# Patient Record
Sex: Female | Born: 1935 | Race: White | Hispanic: No | Smoking: Former smoker
Health system: Southern US, Community
[De-identification: ages and names within clinical notes are randomized; demographics above are authoritative.]

## PROBLEM LIST (undated history)

## (undated) DIAGNOSIS — I2510 Atherosclerotic heart disease of native coronary artery without angina pectoris: Secondary | ICD-10-CM

## (undated) DIAGNOSIS — M1990 Unspecified osteoarthritis, unspecified site: Secondary | ICD-10-CM

## (undated) DIAGNOSIS — T753XXA Motion sickness, initial encounter: Secondary | ICD-10-CM

## (undated) DIAGNOSIS — K219 Gastro-esophageal reflux disease without esophagitis: Secondary | ICD-10-CM

## (undated) DIAGNOSIS — G5601 Carpal tunnel syndrome, right upper limb: Secondary | ICD-10-CM

## (undated) DIAGNOSIS — R51 Headache: Secondary | ICD-10-CM

## (undated) DIAGNOSIS — R519 Headache, unspecified: Secondary | ICD-10-CM

## (undated) DIAGNOSIS — T4145XA Adverse effect of unspecified anesthetic, initial encounter: Secondary | ICD-10-CM

## (undated) DIAGNOSIS — K222 Esophageal obstruction: Secondary | ICD-10-CM

## (undated) DIAGNOSIS — T8859XA Other complications of anesthesia, initial encounter: Secondary | ICD-10-CM

## (undated) DIAGNOSIS — A0472 Enterocolitis due to Clostridium difficile, not specified as recurrent: Secondary | ICD-10-CM

## (undated) DIAGNOSIS — I2109 ST elevation (STEMI) myocardial infarction involving other coronary artery of anterior wall: Secondary | ICD-10-CM

## (undated) DIAGNOSIS — Z972 Presence of dental prosthetic device (complete) (partial): Secondary | ICD-10-CM

## (undated) DIAGNOSIS — R42 Dizziness and giddiness: Secondary | ICD-10-CM

## (undated) DIAGNOSIS — I7300 Raynaud's syndrome without gangrene: Secondary | ICD-10-CM

## (undated) DIAGNOSIS — K5730 Diverticulosis of large intestine without perforation or abscess without bleeding: Secondary | ICD-10-CM

## (undated) DIAGNOSIS — G589 Mononeuropathy, unspecified: Secondary | ICD-10-CM

## (undated) DIAGNOSIS — K08109 Complete loss of teeth, unspecified cause, unspecified class: Secondary | ICD-10-CM

## (undated) DIAGNOSIS — J329 Chronic sinusitis, unspecified: Secondary | ICD-10-CM

## (undated) DIAGNOSIS — E785 Hyperlipidemia, unspecified: Secondary | ICD-10-CM

## (undated) DIAGNOSIS — H9191 Unspecified hearing loss, right ear: Secondary | ICD-10-CM

## (undated) HISTORY — PX: VARICOSE VEIN SURGERY: SHX832

## (undated) HISTORY — PX: KNEE ARTHROSCOPY: SUR90

## (undated) HISTORY — PX: ABDOMINAL HYSTERECTOMY: SHX81

## (undated) HISTORY — PX: ESOPHAGOGASTRODUODENOSCOPY (EGD) WITH ESOPHAGEAL DILATION: SHX5812

## (undated) HISTORY — PX: EYE SURGERY: SHX253

## (undated) HISTORY — PX: RETINAL DETACHMENT SURGERY: SHX105

## (undated) HISTORY — PX: COLONOSCOPY: SHX174

## (undated) HISTORY — PX: TONSILLECTOMY: SUR1361

---

## 2002-03-21 DIAGNOSIS — I2109 ST elevation (STEMI) myocardial infarction involving other coronary artery of anterior wall: Secondary | ICD-10-CM

## 2002-03-21 HISTORY — DX: ST elevation (STEMI) myocardial infarction involving other coronary artery of anterior wall: I21.09

## 2003-01-25 ENCOUNTER — Other Ambulatory Visit: Payer: Self-pay

## 2003-01-26 ENCOUNTER — Other Ambulatory Visit: Payer: Self-pay

## 2003-01-27 HISTORY — PX: CARDIAC CATHETERIZATION: SHX172

## 2003-05-15 ENCOUNTER — Other Ambulatory Visit: Payer: Self-pay

## 2003-08-24 ENCOUNTER — Other Ambulatory Visit: Payer: Self-pay

## 2003-11-24 ENCOUNTER — Other Ambulatory Visit: Payer: Self-pay

## 2003-12-26 ENCOUNTER — Ambulatory Visit: Payer: Self-pay | Admitting: Gastroenterology

## 2004-04-16 ENCOUNTER — Ambulatory Visit: Payer: Self-pay | Admitting: Gastroenterology

## 2004-07-23 ENCOUNTER — Emergency Department: Payer: Self-pay | Admitting: Unknown Physician Specialty

## 2004-07-23 ENCOUNTER — Other Ambulatory Visit: Payer: Self-pay

## 2005-01-01 ENCOUNTER — Inpatient Hospital Stay: Payer: Self-pay | Admitting: Internal Medicine

## 2005-01-01 ENCOUNTER — Other Ambulatory Visit: Payer: Self-pay

## 2005-10-15 ENCOUNTER — Observation Stay: Payer: Self-pay | Admitting: Internal Medicine

## 2005-10-15 ENCOUNTER — Other Ambulatory Visit: Payer: Self-pay

## 2007-02-03 ENCOUNTER — Ambulatory Visit: Payer: Self-pay | Admitting: Gastroenterology

## 2007-05-24 ENCOUNTER — Ambulatory Visit: Payer: Self-pay | Admitting: Gastroenterology

## 2007-05-31 ENCOUNTER — Ambulatory Visit: Payer: Self-pay | Admitting: Gastroenterology

## 2007-06-15 ENCOUNTER — Ambulatory Visit: Payer: Self-pay | Admitting: Orthopedic Surgery

## 2007-06-19 ENCOUNTER — Ambulatory Visit: Payer: Self-pay | Admitting: Orthopedic Surgery

## 2007-06-19 HISTORY — PX: FINGER SURGERY: SHX640

## 2007-07-24 ENCOUNTER — Ambulatory Visit: Payer: Self-pay | Admitting: Surgery

## 2008-02-29 ENCOUNTER — Ambulatory Visit: Payer: Self-pay | Admitting: Orthopedic Surgery

## 2008-03-21 DIAGNOSIS — A0472 Enterocolitis due to Clostridium difficile, not specified as recurrent: Secondary | ICD-10-CM

## 2008-03-21 HISTORY — DX: Enterocolitis due to Clostridium difficile, not specified as recurrent: A04.72

## 2009-01-22 ENCOUNTER — Inpatient Hospital Stay: Payer: Self-pay | Admitting: Internal Medicine

## 2009-02-13 ENCOUNTER — Ambulatory Visit: Payer: Self-pay | Admitting: Cardiology

## 2009-02-13 HISTORY — PX: CORONARY ANGIOPLASTY WITH STENT PLACEMENT: SHX49

## 2009-07-06 ENCOUNTER — Ambulatory Visit: Payer: Self-pay | Admitting: Internal Medicine

## 2009-08-04 ENCOUNTER — Ambulatory Visit: Payer: Self-pay | Admitting: Unknown Physician Specialty

## 2009-11-20 ENCOUNTER — Ambulatory Visit: Payer: Self-pay | Admitting: Unknown Physician Specialty

## 2009-11-25 ENCOUNTER — Inpatient Hospital Stay: Payer: Self-pay | Admitting: Unknown Physician Specialty

## 2009-11-25 HISTORY — PX: TOTAL HIP ARTHROPLASTY: SHX124

## 2009-11-25 HISTORY — PX: JOINT REPLACEMENT: SHX530

## 2009-11-28 LAB — PATHOLOGY REPORT

## 2009-11-30 ENCOUNTER — Encounter: Payer: Self-pay | Admitting: Internal Medicine

## 2009-12-19 ENCOUNTER — Encounter: Payer: Self-pay | Admitting: Internal Medicine

## 2010-06-22 ENCOUNTER — Ambulatory Visit: Payer: Self-pay | Admitting: Unknown Physician Specialty

## 2011-01-25 ENCOUNTER — Ambulatory Visit: Payer: Self-pay | Admitting: Unknown Physician Specialty

## 2011-02-09 ENCOUNTER — Ambulatory Visit: Payer: Self-pay | Admitting: Internal Medicine

## 2012-03-20 ENCOUNTER — Ambulatory Visit: Payer: Self-pay | Admitting: Ophthalmology

## 2012-03-20 DIAGNOSIS — I499 Cardiac arrhythmia, unspecified: Secondary | ICD-10-CM

## 2012-03-27 ENCOUNTER — Ambulatory Visit: Payer: Self-pay | Admitting: Ophthalmology

## 2012-04-17 ENCOUNTER — Ambulatory Visit: Payer: Self-pay | Admitting: Ophthalmology

## 2012-09-06 ENCOUNTER — Ambulatory Visit: Payer: Self-pay | Admitting: Internal Medicine

## 2014-07-11 NOTE — Op Note (Signed)
PATIENT NAME:  Amber Sanford, Amber Sanford MR#:  [id number] DATE OF BIRTH:  [birth year]  DATE OF PROCEDURE:  04/17/2012  PREOPERATIVE DIAGNOSIS: Visually significant cataract of the left eye.   POSTOPERATIVE DIAGNOSIS: Visually significant cataract of the left eye.   OPERATIVE PROCEDURE: Cataract extraction by phacoemulsification with implant of intraocular lens to left eye.   SURGEON: Birder Robson, MD.   ANESTHESIA:  1. Managed anesthesia care.  2. Topical tetracaine drops followed by 2% Xylocaine jelly applied in the preoperative holding area.   COMPLICATIONS: None.   TECHNIQUE:  stop and chop  DESCRIPTION OF PROCEDURE: The patient was examined and consented in the preoperative holding area where the aforementioned topical anesthesia was applied to the left eye and then brought back to the Operating Room where the left eye was prepped and draped in the usual sterile ophthalmic fashion and a lid speculum was placed. A paracentesis was created with the side port blade and the anterior chamber was filled with viscoelastic. A near clear corneal incision was performed with the steel keratome. A continuous curvilinear capsulorrhexis was performed with a cystotome followed by the capsulorrhexis forceps. Hydrodissection and hydrodelineation were carried out with BSS on a blunt cannula. The lens was removed in a stop and chop technique and the remaining cortical material was removed with the irrigation-aspiration handpiece. The capsular bag was inflated with viscoelastic and the IOL was placed in the capsular bag without complication. The remaining viscoelastic was removed from the eye with the irrigation-aspiration handpiece. The wounds were hydrated. The anterior chamber was flushed with Miostat and the eye was inflated to physiologic pressure. 0.1 mL of cefuroxime concentration 10 mg/mL was placed in the anterior chamber. The wounds were found to be water tight. The eye was dressed with Vigamox. The patient was  given protective glasses to wear throughout the day and a shield with which to sleep tonight. The patient was also given drops with which to begin a drop regimen today and will follow-up with me in one day.    ____________________________ Livingston Diones. Mittie Knittel, MD wlp:sb D: 04/17/2012 15:51:00 ET T: 04/17/2012 16:44:30 ET JOB#: [id number]  cc: Lateef Juncaj L. Addyson Traub, MD, <Dictator> Livingston Diones Syriana Croslin MD ELECTRONICALLY SIGNED 04/26/2012 14:29

## 2014-07-11 NOTE — Op Note (Signed)
PATIENT NAME:  Amber Sanford, Amber Sanford MR#:  [id number] DATE OF BIRTH:  10-18-1935  DATE OF PROCEDURE:  03/27/2012  LOCATION:  [place]  PREOPERATIVE DIAGNOSIS: Visually significant cataract of the right eye.   POSTOPERATIVE DIAGNOSIS: Visually significant cataract of the right eye.   OPERATIVE PROCEDURE: Cataract extraction by phacoemulsification with implant of intraocular lens to the right eye.   SURGEON: Birder Robson, MD  ANESTHESIA:  1. Managed anesthesia care.  2. 50-50 mixture of 0.75% bupivacaine and 4% Xylocaine given as a retrobulbar block.   COMPLICATIONS: None.   TECHNIQUE:  Stop and chop.   DESCRIPTION OF PROCEDURE: The patient was examined and consented for this procedure in the preoperative holding area and then brought back to the Operating Room where the anesthesia team employed managed anesthesia care.  3.5 milliliters of the aforementioned mixture were placed in the right orbit on an Atkinson needle without complication. The right eye was then prepped and draped in the usual sterile ophthalmic fashion. A lid speculum was placed. The side-port blade was used to create a paracentesis and the anterior chamber was filled with viscoelastic. The keratome was used to create a near clear corneal incision. The continuous curvilinear capsulorrhexis was performed with a cystotome followed by the capsulorrhexis forceps. Hydrodissection and hydrodelineation were carried out with BSS on a blunt cannula. The lens was removed in a stop and chop technique. The remaining cortical material was removed with the irrigation-aspiration handpiece. The capsular bag was inflated with viscoelastic and the Tecnis ZCB00 20.5-diopter lens, serial number [id number] was placed in the capsular bag without complication. The remaining viscoelastic was removed from the eye with the irrigation-aspiration handpiece. The wounds were hydrated. The anterior chamber was flushed with Miostat and the eye was  inflated to a physiologic pressure. 0.1 mL of cefuroxime concentration 1 mg/mL was placed in the anterior chamber.  The wounds were found to be water tight. The eye was dressed with Vigamox followed by Maxitrol ointment and a protective shield was placed. The patient will followup with me in one day.   ____________________________ Livingston Diones. Santrice Muzio, MD wlp:jm D: 03/27/2012 12:03:52 ET T: 03/27/2012 18:33:13 ET JOB#: [id number]  cc: Mariaha Ellington L. Sigfredo Schreier, MD, <Dictator> Livingston Diones Wilmina Maxham MD ELECTRONICALLY SIGNED 03/29/2012 14:37

## 2014-09-24 DIAGNOSIS — J329 Chronic sinusitis, unspecified: Secondary | ICD-10-CM

## 2014-09-24 HISTORY — DX: Chronic sinusitis, unspecified: J32.9

## 2014-09-26 ENCOUNTER — Encounter: Payer: Self-pay | Admitting: *Deleted

## 2014-10-03 ENCOUNTER — Ambulatory Visit: Payer: PPO | Admitting: Anesthesiology

## 2014-10-03 ENCOUNTER — Ambulatory Visit
Admission: RE | Admit: 2014-10-03 | Discharge: 2014-10-03 | Disposition: A | Discharge status: Home / Self Care | Payer: PPO | Source: Ambulatory Visit | Attending: Unknown Physician Specialty | Admitting: Unknown Physician Specialty

## 2014-10-03 ENCOUNTER — Encounter: Payer: Self-pay | Admitting: *Deleted

## 2014-10-03 ENCOUNTER — Encounter
Admission: RE | Disposition: A | Discharge status: Home / Self Care | Payer: Self-pay | Source: Ambulatory Visit | Attending: Unknown Physician Specialty

## 2014-10-03 DIAGNOSIS — M67441 Ganglion, right hand: Secondary | ICD-10-CM | POA: Insufficient documentation

## 2014-10-03 DIAGNOSIS — Z96641 Presence of right artificial hip joint: Secondary | ICD-10-CM | POA: Insufficient documentation

## 2014-10-03 DIAGNOSIS — H9191 Unspecified hearing loss, right ear: Secondary | ICD-10-CM | POA: Diagnosis not present

## 2014-10-03 DIAGNOSIS — G5601 Carpal tunnel syndrome, right upper limb: Secondary | ICD-10-CM | POA: Diagnosis not present

## 2014-10-03 DIAGNOSIS — K5730 Diverticulosis of large intestine without perforation or abscess without bleeding: Secondary | ICD-10-CM | POA: Insufficient documentation

## 2014-10-03 DIAGNOSIS — Z87891 Personal history of nicotine dependence: Secondary | ICD-10-CM | POA: Diagnosis not present

## 2014-10-03 DIAGNOSIS — Z79899 Other long term (current) drug therapy: Secondary | ICD-10-CM | POA: Diagnosis not present

## 2014-10-03 DIAGNOSIS — G709 Myoneural disorder, unspecified: Secondary | ICD-10-CM | POA: Insufficient documentation

## 2014-10-03 DIAGNOSIS — I252 Old myocardial infarction: Secondary | ICD-10-CM | POA: Insufficient documentation

## 2014-10-03 DIAGNOSIS — I7300 Raynaud's syndrome without gangrene: Secondary | ICD-10-CM | POA: Diagnosis not present

## 2014-10-03 DIAGNOSIS — K219 Gastro-esophageal reflux disease without esophagitis: Secondary | ICD-10-CM | POA: Diagnosis not present

## 2014-10-03 DIAGNOSIS — Z955 Presence of coronary angioplasty implant and graft: Secondary | ICD-10-CM | POA: Insufficient documentation

## 2014-10-03 DIAGNOSIS — E785 Hyperlipidemia, unspecified: Secondary | ICD-10-CM | POA: Insufficient documentation

## 2014-10-03 DIAGNOSIS — I2510 Atherosclerotic heart disease of native coronary artery without angina pectoris: Secondary | ICD-10-CM | POA: Diagnosis not present

## 2014-10-03 DIAGNOSIS — M79641 Pain in right hand: Secondary | ICD-10-CM | POA: Diagnosis present

## 2014-10-03 HISTORY — PX: MASS EXCISION: SHX2000

## 2014-10-03 HISTORY — DX: Atherosclerotic heart disease of native coronary artery without angina pectoris: I25.10

## 2014-10-03 HISTORY — DX: Headache: R51

## 2014-10-03 HISTORY — DX: Motion sickness, initial encounter: T75.3XXA

## 2014-10-03 HISTORY — DX: Hyperlipidemia, unspecified: E78.5

## 2014-10-03 HISTORY — DX: Dizziness and giddiness: R42

## 2014-10-03 HISTORY — DX: Carpal tunnel syndrome, right upper limb: G56.01

## 2014-10-03 HISTORY — DX: Other complications of anesthesia, initial encounter: T88.59XA

## 2014-10-03 HISTORY — DX: Raynaud's syndrome without gangrene: I73.00

## 2014-10-03 HISTORY — DX: Unspecified osteoarthritis, unspecified site: M19.90

## 2014-10-03 HISTORY — DX: Mononeuropathy, unspecified: G58.9

## 2014-10-03 HISTORY — DX: Esophageal obstruction: K22.2

## 2014-10-03 HISTORY — DX: Diverticulosis of large intestine without perforation or abscess without bleeding: K57.30

## 2014-10-03 HISTORY — PX: CARPAL TUNNEL RELEASE: SHX101

## 2014-10-03 HISTORY — DX: Adverse effect of unspecified anesthetic, initial encounter: T41.45XA

## 2014-10-03 HISTORY — DX: Complete loss of teeth, unspecified cause, unspecified class: K08.109

## 2014-10-03 HISTORY — DX: Chronic sinusitis, unspecified: J32.9

## 2014-10-03 HISTORY — DX: Complete loss of teeth, unspecified cause, unspecified class: Z97.2

## 2014-10-03 HISTORY — DX: ST elevation (STEMI) myocardial infarction involving other coronary artery of anterior wall: I21.09

## 2014-10-03 HISTORY — DX: Headache, unspecified: R51.9

## 2014-10-03 HISTORY — DX: Unspecified hearing loss, right ear: H91.91

## 2014-10-03 HISTORY — DX: Enterocolitis due to Clostridium difficile, not specified as recurrent: A04.72

## 2014-10-03 HISTORY — DX: Gastro-esophageal reflux disease without esophagitis: K21.9

## 2014-10-03 SURGERY — CARPAL TUNNEL RELEASE
Anesthesia: Monitor Anesthesia Care | Laterality: Right | Wound class: Clean

## 2014-10-03 MED ORDER — LACTATED RINGERS IV SOLN
INTRAVENOUS | Status: DC
  Administered 2014-10-03 (×2): via INTRAVENOUS

## 2014-10-03 MED ORDER — FENTANYL CITRATE (PF) 100 MCG/2ML IJ SOLN
INTRAMUSCULAR | Status: DC | PRN
  Administered 2014-10-03: 50 ug via INTRAVENOUS

## 2014-10-03 MED ORDER — MIDAZOLAM HCL 2 MG/2ML IJ SOLN
INTRAMUSCULAR | Status: DC | PRN
Start: 2014-10-03 — End: 2014-10-03
  Administered 2014-10-03: 0.5 mg via INTRAVENOUS
  Administered 2014-10-03: 1 mg via INTRAVENOUS

## 2014-10-03 MED ORDER — ROPIVACAINE HCL 5 MG/ML IJ SOLN
INTRAMUSCULAR | Status: DC | PRN
  Administered 2014-10-03: 33 mL via PERINEURAL

## 2014-10-03 MED ORDER — FENTANYL CITRATE (PF) 100 MCG/2ML IJ SOLN: 25.0000 ug | INTRAMUSCULAR | Status: DC | PRN

## 2014-10-03 MED ORDER — DEXAMETHASONE SODIUM PHOSPHATE 4 MG/ML IJ SOLN
INTRAMUSCULAR | Status: DC | PRN
  Administered 2014-10-03: 4 mg via PERINEURAL

## 2014-10-03 MED ORDER — OXYCODONE HCL 5 MG PO TABS: 5.0000 mg | ORAL_TABLET | Freq: Once | ORAL | Status: DC | PRN

## 2014-10-03 MED ORDER — ONDANSETRON HCL 4 MG/2ML IJ SOLN
INTRAMUSCULAR | Status: DC | PRN
  Administered 2014-10-03: 4 mg via INTRAVENOUS

## 2014-10-03 MED ORDER — ACETAMINOPHEN 325 MG PO TABS: 325.0000 mg | ORAL_TABLET | ORAL | Status: DC | PRN

## 2014-10-03 MED ORDER — MIDAZOLAM HCL 2 MG/2ML IJ SOLN: INTRAMUSCULAR | Status: DC | PRN

## 2014-10-03 MED ORDER — ACETAMINOPHEN 160 MG/5ML PO SOLN: 325.0000 mg | ORAL | Status: DC | PRN

## 2014-10-03 MED ORDER — PROPOFOL INFUSION 10 MG/ML OPTIME
INTRAVENOUS | Status: DC | PRN
  Administered 2014-10-03: 50 ug/kg/min via INTRAVENOUS

## 2014-10-03 MED ORDER — LIDOCAINE HCL (CARDIAC) 20 MG/ML IV SOLN: INTRAVENOUS | Status: DC | PRN

## 2014-10-03 MED ORDER — OXYCODONE HCL 5 MG/5ML PO SOLN: 5.0000 mg | Freq: Once | ORAL | Status: DC | PRN

## 2014-10-03 MED ORDER — NORCO 5-325 MG PO TABS: 1.0000 | ORAL_TABLET | Freq: Four times a day (QID) | ORAL | Status: DC | PRN

## 2014-10-03 MED ORDER — ONDANSETRON HCL 4 MG/2ML IJ SOLN: 4.0000 mg | Freq: Once | INTRAMUSCULAR | Status: DC | PRN

## 2014-10-03 SURGICAL SUPPLY — 60 items
ADHESIVE MASTISOL STRL (MISCELLANEOUS) IMPLANT
BANDAGE ELASTIC 2 CLIP NS LF (GAUZE/BANDAGES/DRESSINGS) ×3 IMPLANT
BLADE SURG 15 STRL LF DISP TIS (BLADE) IMPLANT
BLADE SURG 15 STRL SS (BLADE)
BNDG ESMARK 4X12 TAN STRL LF (GAUZE/BANDAGES/DRESSINGS) ×3 IMPLANT
BNDG GAUZE 4.5X4.1 6PLY STRL (MISCELLANEOUS) IMPLANT
CANISTER SUCT 1200ML W/VALVE (MISCELLANEOUS) ×3 IMPLANT
CLOSURE WOUND 1/2 X4 (GAUZE/BANDAGES/DRESSINGS)
CLOSURE WOUND 1/4X4 (GAUZE/BANDAGES/DRESSINGS)
COVER LIGHT HANDLE FLEXIBLE (MISCELLANEOUS) ×3 IMPLANT
CUFF TOURN SGL QUICK 18 (TOURNIQUET CUFF) ×3 IMPLANT
DRESSING TELFA 4X3 1S ST N-ADH (GAUZE/BANDAGES/DRESSINGS) IMPLANT
DURAPREP 26ML APPLICATOR (WOUND CARE) ×3 IMPLANT
GAUZE SPONGE 4X4 12PLY STRL (GAUZE/BANDAGES/DRESSINGS) ×3 IMPLANT
GAUZE STRETCH 2X75IN STRL (MISCELLANEOUS) IMPLANT
GLOVE BIO SURGEON STRL SZ7.5 (GLOVE) ×6 IMPLANT
GLOVE BIO SURGEON STRL SZ8 (GLOVE) ×3 IMPLANT
GLOVE INDICATOR 8.0 STRL GRN (GLOVE) ×6 IMPLANT
GOWN STRL REUS W/ TWL LRG LVL3 (GOWN DISPOSABLE) ×2 IMPLANT
GOWN STRL REUS W/TWL LRG LVL3 (GOWN DISPOSABLE) ×4
LOOP VESSEL RED MINI 1.3X0.9 (MISCELLANEOUS) ×1 IMPLANT
LOOPS RED MINI 1.3MMX0.9MM (MISCELLANEOUS) ×2
NS IRRIG 500ML POUR BTL (IV SOLUTION) ×3 IMPLANT
PACK EXTREMITY ARMC (MISCELLANEOUS) ×3 IMPLANT
PAD GROUND ADULT SPLIT (MISCELLANEOUS) ×3 IMPLANT
PADDING CAST 2X4YD ST (MISCELLANEOUS) ×2
PADDING CAST 3IN STRL (MISCELLANEOUS)
PADDING CAST BLEND 2X4 STRL (MISCELLANEOUS) ×1 IMPLANT
PADDING CAST BLEND 3X4 STRL (MISCELLANEOUS) IMPLANT
SOL PREP PVP 2OZ (MISCELLANEOUS) ×3
SOLUTION PREP PVP 2OZ (MISCELLANEOUS) ×1 IMPLANT
SPLINT CAST 1 STEP 3X12 (MISCELLANEOUS) ×3 IMPLANT
SPONGE LAP 18X18 5 PK (GAUZE/BANDAGES/DRESSINGS) IMPLANT
SPONGE XRAY 4X4 16PLY STRL (MISCELLANEOUS) IMPLANT
STOCKINETTE 4X48 STRL (DRAPES) ×3 IMPLANT
STOCKINETTE STRL 6IN 960660 (GAUZE/BANDAGES/DRESSINGS) IMPLANT
STRAP BODY AND KNEE 60X3 (MISCELLANEOUS) ×3 IMPLANT
STRIP CLOSURE SKIN 1/2X4 (GAUZE/BANDAGES/DRESSINGS) IMPLANT
STRIP CLOSURE SKIN 1/4X4 (GAUZE/BANDAGES/DRESSINGS) IMPLANT
SUT ETHILON 2 0  PS2 NEEDLE (SUTURE)
SUT ETHILON 2 0 FSLX (SUTURE) IMPLANT
SUT ETHILON 2 0 PS2 NEEDLE (SUTURE) IMPLANT
SUT ETHILON 3-0 FS-10 30 BLK (SUTURE)
SUT ETHILON 4-0 (SUTURE) ×4
SUT ETHILON 4-0 FS2 18XMFL BLK (SUTURE) ×2
SUT MNCRL 4-0 (SUTURE)
SUT MNCRL 4-0 27XMFL (SUTURE)
SUT MNCRL 5-0+ PC-1 (SUTURE) IMPLANT
SUT MONOCRYL 5-0 (SUTURE)
SUT VIC AB 2-0 SH 27 (SUTURE)
SUT VIC AB 2-0 SH 27XBRD (SUTURE) IMPLANT
SUT VIC AB 3-0 SH 27 (SUTURE)
SUT VIC AB 3-0 SH 27X BRD (SUTURE) IMPLANT
SUT VIC AB 4-0 RB1 27 (SUTURE)
SUT VIC AB 4-0 RB1 27X BRD (SUTURE) IMPLANT
SUTURE EHLN 3-0 FS-10 30 BLK (SUTURE) IMPLANT
SUTURE ETHLN 4-0 FS2 18XMF BLK (SUTURE) ×2 IMPLANT
SUTURE MNCRL 4-0 27XMF (SUTURE) IMPLANT
SYR 50ML LL SCALE MARK (SYRINGE) IMPLANT
TAPE MICROFOAM 4IN (TAPE) IMPLANT

## 2014-10-03 NOTE — Progress Notes (Signed)
Assisted Clance Boll ANMD with right, supraclavicular block. Side rails up, monitors on throughout procedure. See vital signs in flow sheet. Tolerated Procedure well.

## 2014-10-03 NOTE — Anesthesia Postprocedure Evaluation (Signed)
  Anesthesia Post-op Note  Patient: Amber Sanford  Procedure(s) Performed: Procedure(s): OPEN CARPAL TUNNEL RELEASE (Right) MINOR EXCISION OF GANGLION MIDDLE FINGER (Right)  Anesthesia type:MAC, Regional  Patient location: PACU  Post pain: Pain level controlled  Post assessment: Post-op Vital signs reviewed, Patient's Cardiovascular Status Stable, Respiratory Function Stable, Patent Airway and No signs of Nausea or vomiting  Post vital signs: Reviewed and stable  Last Vitals:  Filed Vitals:   10/03/14 1200  BP: 121/67  Pulse: 72  Temp:   Resp: 14    Level of consciousness: awake, alert  and patient cooperative  Complications: No apparent anesthesia complications

## 2014-10-03 NOTE — H&P (Signed)
  H and P reviewed. No changes. Uploaded at later date. 

## 2014-10-03 NOTE — Anesthesia Preprocedure Evaluation (Signed)
Anesthesia Evaluation  Patient identified by MRN, date of birth, ID band  Reviewed: Allergy & Precautions, H&P , NPO status , Patient's Chart, lab work & pertinent test results  Airway Mallampati: II  TM Distance: >3 FB Neck ROM: full    Dental  (+) Upper Dentures, Lower Dentures   Pulmonary former smoker,    Pulmonary exam normal       Cardiovascular + CAD, + Past MI and + Cardiac Stents Rhythm:regular Rate:Normal     Neuro/Psych  Neuromuscular disease    GI/Hepatic GERD-  ,  Endo/Other    Renal/GU      Musculoskeletal   Abdominal   Peds  Hematology   Anesthesia Other Findings   Reproductive/Obstetrics                             Anesthesia Physical Anesthesia Plan  ASA: II  Anesthesia Plan: MAC and Regional   Post-op Pain Management:    Induction:   Airway Management Planned:   Additional Equipment:   Intra-op Plan:   Post-operative Plan:   Informed Consent: I have reviewed the patients History and Physical, chart, labs and discussed the procedure including the risks, benefits and alternatives for the proposed anesthesia with the patient or authorized representative who has indicated his/her understanding and acceptance.     Plan Discussed with: CRNA  Anesthesia Plan Comments:         Anesthesia Quick Evaluation

## 2014-10-03 NOTE — Anesthesia Procedure Notes (Signed)
Anesthesia Regional Block:  Supraclavicular block  Pre-Anesthetic Checklist: ,, timeout performed, Correct Patient, Correct Site, Correct Laterality, Correct Procedure, Correct Position, site marked, Risks and benefits discussed,  Surgical consent,  Pre-op evaluation,  At surgeon's request and post-op pain management  Laterality: Right  Prep: chloraprep       Needles:  Injection technique: Single-shot  Needle Type: Echogenic Stimulator Needle      Needle Gauge: 21 and 21 G    Additional Needles:  Procedures: ultrasound guided (picture in chart) and nerve stimulator Supraclavicular block  Nerve Stimulator or Paresthesia:  Response: bicep contraction, 0.45 mA,   Additional Responses:   Narrative:  Start time: 10/03/2014 9:27 AM End time: 10/03/2014 9:31 AM Injection made incrementally with aspirations every 5 mL.  Performed by: Personally  Anesthesiologist: Ronelle Nigh  Additional Notes: Functioning IV was confirmed and monitors applied.  Sterile prep and drape,hand hygiene and sterile gloves were used.Ultrasound guidance: relevant anatomy identified, needle position confirmed, local anesthetic spread visualized around nerve(s)., vascular puncture avoided.  Image printed for medical record.  Negative aspiration and negative test dose prior to incremental administration of local anesthetic. The patient tolerated the procedure well. Vitals signes recorded in RN notes.

## 2014-10-03 NOTE — Op Note (Signed)
DATE OF SURGERY:  10/03/2014  PATIENT NAME:  Amber Sanford   DOB: 01/19/1936  MRN: [id number]  PRE-OPERATIVE DIAGNOSIS: Right carpal tunnel syndrome plus ganglion right middle finger  POST-OPERATIVE DIAGNOSIS:  Same  PROCEDURE: Right carpal tunnel release plus excision of right middle finger ganglion  SURGEON: Dr. Leanor Kail, Brooke Bonito. M.D.  ANESTHESIA: Supraclavicular block  ESTIMATED BLOOD LOSS: Negligible  TOURNIQUET TIME: 16 minutes  DRAINS: None  INDICATIONS FOR SURGERY: [name] is a 79 y.o. year old female with a long history of numbness and paresthesias in the right hand. Nerve conduction studies demonstrated findings consistent with carpal tunnel syndrome.The patient had not seen any significant improvement despite conservative nonsurgical intervention. After discussion of the risks and benefits of surgical intervention, the patient expressed understanding of the risks benefits and agree with plans for carpal tunnel release. The patient was also noted to have a small painful ganglion about the ulnar aspect of the proximal phalanx of her right middle finger. This was to be excised at the time of her carpal tunnel release.  PROCEDURE IN DETAIL: The patient was brought into the operating room and after adequate regional anesthesia. A tourniquet was placed on the patient's right upper arm.The right hand and arm were prepped with alcohol and Duraprep and draped in the usual sterile fashion. A "time-out" was performed as per usual protocol. The hand and forearm were exsanguinated using an Esmarch and the tourniquet was inflated to 250 mmHg.  An incision was made just ulnar to the thenar palmar crease. Dissection was carried down through the palmar fascia to the transverse carpal ligament. The transverse carpal ligament was sharply incised, taking care to protect the underlying structures within the carpal tunnel. Complete release of the transverse carpal ligament was achieved. There was  no evidence of a mass or proliferative synovitis within the carpal tunnel.  Next, I made a short longitudinal incision over the ulnar aspect of the proximal phalanx of the right middle finger. I bluntly dissected through the soft tissue down to a small ganglion. I then excised the ganglion  by means of sharp dissection. I then used a small ronguer to further debride the ganglion site. Finally I used coagulation cautery to remove any residual ganglion tissue. The wounds were irrigated with saline. The tourniquet was released at this time. Bleeding was controlled with coagulation cautery.  The wounds were then re-approximated with interrupted sutures of #4-0 nylon. A sterile dressing was applied followed by application of a volar splint.  The patient tolerated the procedure well and was transported to the PACU in stable condition.  Dr. Mariel Kansky. M.D.

## 2014-10-03 NOTE — Discharge Instructions (Signed)
General Anesthesia, Care After Refer to this sheet in the next few weeks. These instructions provide you with information on caring for yourself after your procedure. Your health care provider may also give you more specific instructions. Your treatment has been planned according to current medical practices, but problems sometimes occur. Call your health care provider if you have any problems or questions after your procedure. WHAT TO EXPECT AFTER THE PROCEDURE After the procedure, it is typical to experience:  Sleepiness.  Nausea and vomiting. HOME CARE INSTRUCTIONS  For the first 24 hours after general anesthesia:  Have a responsible person with you.  Do not drive a car. If you are alone, do not take public transportation.  Do not drink alcohol.  Do not take medicine that has not been prescribed by your health care provider.  Do not sign important papers or make important decisions.  You may resume a normal diet and activities as directed by your health care provider.  Change bandages (dressings) as directed.  If you have questions or problems that seem related to general anesthesia, call the hospital and ask for the anesthetist or anesthesiologist on call. SEEK MEDICAL CARE IF:  You have nausea and vomiting that continue the day after anesthesia.  You develop a rash. SEEK IMMEDIATE MEDICAL CARE IF:   You have difficulty breathing.  You have chest pain.  You have any allergic problems. Document Released: 06/13/2000 Document Revised: 03/12/2013 Document Reviewed: 09/20/2012 [facility] Patient Information 2015 [place], [place]. This information is not intended to replace advice given to you by your health care provider. Make sure you discuss any questions you have with your health care provider.  Intermittent elevation Ice pack prn RTC in about 10 days Keep dressing dry--can buy an extremity protector or use garbage bag for showering

## 2014-10-03 NOTE — Transfer of Care (Signed)
Immediate Anesthesia Transfer of Care Note  Patient: Amber Sanford  Procedure(s) Performed: Procedure(s): OPEN CARPAL TUNNEL RELEASE (Right) MINOR EXCISION OF MASS MIDDLE FINGER (Right)  Patient Location: PACU  Anesthesia Type: MAC, Regional  Level of Consciousness: awake, alert  and patient cooperative  Airway and Oxygen Therapy: Patient Spontanous Breathing and Patient connected to supplemental oxygen  Post-op Assessment: Post-op Vital signs reviewed, Patient's Cardiovascular Status Stable, Respiratory Function Stable, Patent Airway and No signs of Nausea or vomiting  Post-op Vital Signs: Reviewed and stable  Complications: No apparent anesthesia complications

## 2015-03-25 DIAGNOSIS — G8929 Other chronic pain: Secondary | ICD-10-CM | POA: Diagnosis not present

## 2015-03-25 DIAGNOSIS — Z23 Encounter for immunization: Secondary | ICD-10-CM | POA: Diagnosis not present

## 2015-03-25 DIAGNOSIS — M159 Polyosteoarthritis, unspecified: Secondary | ICD-10-CM | POA: Diagnosis not present

## 2015-03-25 DIAGNOSIS — M5441 Lumbago with sciatica, right side: Secondary | ICD-10-CM | POA: Diagnosis not present

## 2015-03-25 DIAGNOSIS — I2510 Atherosclerotic heart disease of native coronary artery without angina pectoris: Secondary | ICD-10-CM | POA: Diagnosis not present

## 2015-03-25 DIAGNOSIS — Z0000 Encounter for general adult medical examination without abnormal findings: Secondary | ICD-10-CM | POA: Diagnosis not present

## 2015-04-13 DIAGNOSIS — I10 Essential (primary) hypertension: Secondary | ICD-10-CM | POA: Diagnosis not present

## 2015-04-13 DIAGNOSIS — Z955 Presence of coronary angioplasty implant and graft: Secondary | ICD-10-CM | POA: Diagnosis not present

## 2015-04-13 DIAGNOSIS — I2510 Atherosclerotic heart disease of native coronary artery without angina pectoris: Secondary | ICD-10-CM | POA: Diagnosis not present

## 2015-06-29 DIAGNOSIS — D485 Neoplasm of uncertain behavior of skin: Secondary | ICD-10-CM | POA: Diagnosis not present

## 2015-06-29 DIAGNOSIS — L578 Other skin changes due to chronic exposure to nonionizing radiation: Secondary | ICD-10-CM | POA: Diagnosis not present

## 2015-06-29 DIAGNOSIS — L820 Inflamed seborrheic keratosis: Secondary | ICD-10-CM | POA: Diagnosis not present

## 2015-06-29 DIAGNOSIS — L570 Actinic keratosis: Secondary | ICD-10-CM | POA: Diagnosis not present

## 2015-06-29 DIAGNOSIS — C4492 Squamous cell carcinoma of skin, unspecified: Secondary | ICD-10-CM

## 2015-06-29 DIAGNOSIS — L821 Other seborrheic keratosis: Secondary | ICD-10-CM | POA: Diagnosis not present

## 2015-06-29 DIAGNOSIS — C44329 Squamous cell carcinoma of skin of other parts of face: Secondary | ICD-10-CM | POA: Diagnosis not present

## 2015-06-29 HISTORY — DX: Squamous cell carcinoma of skin, unspecified: C44.92

## 2015-08-25 ENCOUNTER — Other Ambulatory Visit: Payer: Self-pay | Admitting: Internal Medicine

## 2015-08-25 ENCOUNTER — Ambulatory Visit
Admission: RE | Admit: 2015-08-25 | Discharge: 2015-08-25 | Disposition: A | Discharge status: Home / Self Care | Payer: PPO | Source: Ambulatory Visit | Attending: Internal Medicine | Admitting: Internal Medicine

## 2015-08-25 DIAGNOSIS — S0990XA Unspecified injury of head, initial encounter: Secondary | ICD-10-CM

## 2015-08-25 DIAGNOSIS — R0781 Pleurodynia: Secondary | ICD-10-CM | POA: Diagnosis not present

## 2015-08-25 DIAGNOSIS — W1809XA Striking against other object with subsequent fall, initial encounter: Secondary | ICD-10-CM | POA: Insufficient documentation

## 2015-08-25 DIAGNOSIS — R220 Localized swelling, mass and lump, head: Secondary | ICD-10-CM | POA: Diagnosis not present

## 2015-08-25 DIAGNOSIS — Z955 Presence of coronary angioplasty implant and graft: Secondary | ICD-10-CM | POA: Diagnosis not present

## 2015-08-25 DIAGNOSIS — G5601 Carpal tunnel syndrome, right upper limb: Secondary | ICD-10-CM | POA: Diagnosis not present

## 2015-08-25 DIAGNOSIS — W1800XA Striking against unspecified object with subsequent fall, initial encounter: Secondary | ICD-10-CM | POA: Diagnosis not present

## 2015-08-25 DIAGNOSIS — Z23 Encounter for immunization: Secondary | ICD-10-CM | POA: Diagnosis not present

## 2015-09-09 DIAGNOSIS — L570 Actinic keratosis: Secondary | ICD-10-CM | POA: Diagnosis not present

## 2015-09-09 DIAGNOSIS — L820 Inflamed seborrheic keratosis: Secondary | ICD-10-CM | POA: Diagnosis not present

## 2015-09-09 DIAGNOSIS — L578 Other skin changes due to chronic exposure to nonionizing radiation: Secondary | ICD-10-CM | POA: Diagnosis not present

## 2015-09-09 DIAGNOSIS — D229 Melanocytic nevi, unspecified: Secondary | ICD-10-CM | POA: Diagnosis not present

## 2015-09-09 DIAGNOSIS — Z85828 Personal history of other malignant neoplasm of skin: Secondary | ICD-10-CM | POA: Diagnosis not present

## 2015-09-23 DIAGNOSIS — R790 Abnormal level of blood mineral: Secondary | ICD-10-CM | POA: Diagnosis not present

## 2015-09-23 DIAGNOSIS — K219 Gastro-esophageal reflux disease without esophagitis: Secondary | ICD-10-CM | POA: Diagnosis not present

## 2015-09-23 DIAGNOSIS — I2510 Atherosclerotic heart disease of native coronary artery without angina pectoris: Secondary | ICD-10-CM | POA: Diagnosis not present

## 2015-09-23 DIAGNOSIS — Z955 Presence of coronary angioplasty implant and graft: Secondary | ICD-10-CM | POA: Diagnosis not present

## 2015-09-23 DIAGNOSIS — K449 Diaphragmatic hernia without obstruction or gangrene: Secondary | ICD-10-CM | POA: Diagnosis not present

## 2015-09-23 DIAGNOSIS — E7800 Pure hypercholesterolemia, unspecified: Secondary | ICD-10-CM | POA: Diagnosis not present

## 2015-10-15 DIAGNOSIS — Z955 Presence of coronary angioplasty implant and graft: Secondary | ICD-10-CM | POA: Diagnosis not present

## 2015-10-15 DIAGNOSIS — I2510 Atherosclerotic heart disease of native coronary artery without angina pectoris: Secondary | ICD-10-CM | POA: Diagnosis not present

## 2016-02-01 DIAGNOSIS — L578 Other skin changes due to chronic exposure to nonionizing radiation: Secondary | ICD-10-CM | POA: Diagnosis not present

## 2016-02-01 DIAGNOSIS — D229 Melanocytic nevi, unspecified: Secondary | ICD-10-CM | POA: Diagnosis not present

## 2016-02-01 DIAGNOSIS — Z1283 Encounter for screening for malignant neoplasm of skin: Secondary | ICD-10-CM | POA: Diagnosis not present

## 2016-02-01 DIAGNOSIS — L820 Inflamed seborrheic keratosis: Secondary | ICD-10-CM | POA: Diagnosis not present

## 2016-02-01 DIAGNOSIS — I8393 Asymptomatic varicose veins of bilateral lower extremities: Secondary | ICD-10-CM | POA: Diagnosis not present

## 2016-02-01 DIAGNOSIS — Z85828 Personal history of other malignant neoplasm of skin: Secondary | ICD-10-CM | POA: Diagnosis not present

## 2016-02-01 DIAGNOSIS — L821 Other seborrheic keratosis: Secondary | ICD-10-CM | POA: Diagnosis not present

## 2016-02-01 DIAGNOSIS — L570 Actinic keratosis: Secondary | ICD-10-CM | POA: Diagnosis not present

## 2016-02-01 DIAGNOSIS — L812 Freckles: Secondary | ICD-10-CM | POA: Diagnosis not present

## 2016-02-01 DIAGNOSIS — L719 Rosacea, unspecified: Secondary | ICD-10-CM | POA: Diagnosis not present

## 2016-02-23 DIAGNOSIS — K1121 Acute sialoadenitis: Secondary | ICD-10-CM | POA: Diagnosis not present

## 2016-03-07 DIAGNOSIS — H01003 Unspecified blepharitis right eye, unspecified eyelid: Secondary | ICD-10-CM | POA: Diagnosis not present

## 2016-03-18 ENCOUNTER — Other Ambulatory Visit: Payer: Self-pay | Admitting: Gastroenterology

## 2016-03-18 DIAGNOSIS — R1310 Dysphagia, unspecified: Secondary | ICD-10-CM | POA: Diagnosis not present

## 2016-03-18 DIAGNOSIS — R1319 Other dysphagia: Secondary | ICD-10-CM

## 2016-03-23 ENCOUNTER — Ambulatory Visit
Admission: RE | Admit: 2016-03-23 | Discharge: 2016-03-23 | Disposition: A | Discharge status: Home / Self Care | Payer: PPO | Source: Ambulatory Visit | Attending: Gastroenterology | Admitting: Gastroenterology

## 2016-03-23 DIAGNOSIS — K219 Gastro-esophageal reflux disease without esophagitis: Secondary | ICD-10-CM | POA: Diagnosis not present

## 2016-03-23 DIAGNOSIS — R1319 Other dysphagia: Secondary | ICD-10-CM

## 2016-03-23 DIAGNOSIS — R1310 Dysphagia, unspecified: Secondary | ICD-10-CM | POA: Diagnosis not present

## 2016-03-23 DIAGNOSIS — K224 Dyskinesia of esophagus: Secondary | ICD-10-CM | POA: Insufficient documentation

## 2016-03-23 DIAGNOSIS — K449 Diaphragmatic hernia without obstruction or gangrene: Secondary | ICD-10-CM | POA: Diagnosis not present

## 2016-04-11 DIAGNOSIS — I2510 Atherosclerotic heart disease of native coronary artery without angina pectoris: Secondary | ICD-10-CM | POA: Diagnosis not present

## 2016-04-11 DIAGNOSIS — Z955 Presence of coronary angioplasty implant and graft: Secondary | ICD-10-CM | POA: Diagnosis not present

## 2016-04-11 DIAGNOSIS — I10 Essential (primary) hypertension: Secondary | ICD-10-CM | POA: Diagnosis not present

## 2016-04-11 DIAGNOSIS — E785 Hyperlipidemia, unspecified: Secondary | ICD-10-CM | POA: Diagnosis not present

## 2016-04-12 ENCOUNTER — Encounter: Payer: Self-pay | Admitting: *Deleted

## 2016-04-13 ENCOUNTER — Encounter
Admission: RE | Disposition: A | Discharge status: Home / Self Care | Payer: Self-pay | Source: Ambulatory Visit | Attending: Unknown Physician Specialty

## 2016-04-13 ENCOUNTER — Ambulatory Visit
Admission: RE | Admit: 2016-04-13 | Discharge: 2016-04-13 | Disposition: A | Discharge status: Home / Self Care | Payer: PPO | Source: Ambulatory Visit | Attending: Unknown Physician Specialty | Admitting: Unknown Physician Specialty

## 2016-04-13 ENCOUNTER — Encounter: Payer: Self-pay | Admitting: *Deleted

## 2016-04-13 ENCOUNTER — Ambulatory Visit: Payer: PPO | Admitting: Anesthesiology

## 2016-04-13 DIAGNOSIS — K2980 Duodenitis without bleeding: Secondary | ICD-10-CM | POA: Insufficient documentation

## 2016-04-13 DIAGNOSIS — I2510 Atherosclerotic heart disease of native coronary artery without angina pectoris: Secondary | ICD-10-CM | POA: Insufficient documentation

## 2016-04-13 DIAGNOSIS — Z96641 Presence of right artificial hip joint: Secondary | ICD-10-CM | POA: Insufficient documentation

## 2016-04-13 DIAGNOSIS — Z87891 Personal history of nicotine dependence: Secondary | ICD-10-CM | POA: Diagnosis not present

## 2016-04-13 DIAGNOSIS — R1310 Dysphagia, unspecified: Secondary | ICD-10-CM | POA: Insufficient documentation

## 2016-04-13 DIAGNOSIS — I8500 Esophageal varices without bleeding: Secondary | ICD-10-CM | POA: Diagnosis not present

## 2016-04-13 DIAGNOSIS — K2970 Gastritis, unspecified, without bleeding: Secondary | ICD-10-CM | POA: Insufficient documentation

## 2016-04-13 DIAGNOSIS — K222 Esophageal obstruction: Secondary | ICD-10-CM | POA: Diagnosis not present

## 2016-04-13 DIAGNOSIS — K2960 Other gastritis without bleeding: Secondary | ICD-10-CM | POA: Diagnosis not present

## 2016-04-13 DIAGNOSIS — I252 Old myocardial infarction: Secondary | ICD-10-CM | POA: Diagnosis not present

## 2016-04-13 DIAGNOSIS — K2950 Unspecified chronic gastritis without bleeding: Secondary | ICD-10-CM | POA: Diagnosis not present

## 2016-04-13 DIAGNOSIS — K219 Gastro-esophageal reflux disease without esophagitis: Secondary | ICD-10-CM | POA: Insufficient documentation

## 2016-04-13 DIAGNOSIS — Z955 Presence of coronary angioplasty implant and graft: Secondary | ICD-10-CM | POA: Diagnosis not present

## 2016-04-13 DIAGNOSIS — Z79899 Other long term (current) drug therapy: Secondary | ICD-10-CM | POA: Insufficient documentation

## 2016-04-13 DIAGNOSIS — K5289 Other specified noninfective gastroenteritis and colitis: Secondary | ICD-10-CM | POA: Diagnosis not present

## 2016-04-13 DIAGNOSIS — E785 Hyperlipidemia, unspecified: Secondary | ICD-10-CM | POA: Diagnosis not present

## 2016-04-13 DIAGNOSIS — I25119 Atherosclerotic heart disease of native coronary artery with unspecified angina pectoris: Secondary | ICD-10-CM | POA: Diagnosis not present

## 2016-04-13 DIAGNOSIS — K449 Diaphragmatic hernia without obstruction or gangrene: Secondary | ICD-10-CM | POA: Insufficient documentation

## 2016-04-13 HISTORY — PX: ESOPHAGOGASTRODUODENOSCOPY (EGD) WITH PROPOFOL: SHX5813

## 2016-04-13 HISTORY — PX: SAVORY DILATION: SHX5439

## 2016-04-13 SURGERY — ESOPHAGOGASTRODUODENOSCOPY (EGD) WITH PROPOFOL
Anesthesia: General

## 2016-04-13 MED ORDER — PROPOFOL 10 MG/ML IV BOLUS
INTRAVENOUS | Status: DC | PRN
  Administered 2016-04-13: 100 mg via INTRAVENOUS

## 2016-04-13 MED ORDER — SODIUM CHLORIDE 0.9 % IV SOLN
INTRAVENOUS | Status: DC
  Administered 2016-04-13: 1000 mL via INTRAVENOUS
  Administered 2016-04-13: 11:00:00 via INTRAVENOUS

## 2016-04-13 MED ORDER — FENTANYL CITRATE (PF) 100 MCG/2ML IJ SOLN
INTRAMUSCULAR | Status: AC
Start: 2016-04-13 — End: 2016-04-13
  Filled 2016-04-13: qty 2

## 2016-04-13 MED ORDER — SODIUM CHLORIDE 0.9 % IV SOLN: INTRAVENOUS | Status: DC

## 2016-04-13 MED ORDER — MIDAZOLAM HCL 5 MG/5ML IJ SOLN
INTRAMUSCULAR | Status: DC | PRN
  Administered 2016-04-13: 1 mg via INTRAVENOUS

## 2016-04-13 MED ORDER — EPHEDRINE SULFATE 50 MG/ML IJ SOLN
INTRAMUSCULAR | Status: DC | PRN
  Administered 2016-04-13: 10 mg via INTRAVENOUS

## 2016-04-13 MED ORDER — LIDOCAINE 2% (20 MG/ML) 5 ML SYRINGE
INTRAMUSCULAR | Status: DC | PRN
Start: 2016-04-13 — End: 2016-04-13
  Administered 2016-04-13: 30 mg via INTRAVENOUS

## 2016-04-13 MED ORDER — PHENYLEPHRINE HCL 10 MG/ML IJ SOLN
INTRAMUSCULAR | Status: AC
  Filled 2016-04-13: qty 1

## 2016-04-13 MED ORDER — PROPOFOL 500 MG/50ML IV EMUL
INTRAVENOUS | Status: AC
  Filled 2016-04-13: qty 50

## 2016-04-13 MED ORDER — LIDOCAINE HCL (PF) 2 % IJ SOLN
INTRAMUSCULAR | Status: AC
  Filled 2016-04-13: qty 2

## 2016-04-13 MED ORDER — EPHEDRINE 5 MG/ML INJ
INTRAVENOUS | Status: AC
  Filled 2016-04-13: qty 10

## 2016-04-13 MED ORDER — PROPOFOL 500 MG/50ML IV EMUL
INTRAVENOUS | Status: DC | PRN
  Administered 2016-04-13: 140 ug/kg/min via INTRAVENOUS

## 2016-04-13 MED ORDER — FENTANYL CITRATE (PF) 100 MCG/2ML IJ SOLN
INTRAMUSCULAR | Status: DC | PRN
  Administered 2016-04-13: 50 ug via INTRAVENOUS

## 2016-04-13 MED ORDER — PHENYLEPHRINE HCL 10 MG/ML IJ SOLN
INTRAMUSCULAR | Status: DC | PRN
  Administered 2016-04-13: 100 ug via INTRAVENOUS

## 2016-04-13 MED ORDER — MIDAZOLAM HCL 2 MG/2ML IJ SOLN
INTRAMUSCULAR | Status: AC
  Filled 2016-04-13: qty 2

## 2016-04-13 NOTE — Op Note (Signed)
[facility] Gastroenterology Patient Name: Amber Sanford Procedure Date: 04/13/2016 11:35 AM MRN: [id number] Account #: [id number] Date of Birth: 30-Sep-1935 Admit Type: Outpatient Age: 81 Room: [facility] ENDO ROOM 4 Gender: Female Note Status: Finalized Procedure:            Upper GI endoscopy Indications:          Dysphagia Providers:            Manya Silvas, MD Referring MD:         Tracie Harrier, MD (Referring MD) Medicines:            Propofol per Anesthesia Complications:        No immediate complications. Procedure:            Pre-Anesthesia Assessment:                       - After reviewing the risks and benefits, the patient                        was deemed in satisfactory condition to undergo the                        procedure.                       After obtaining informed consent, the endoscope was                        passed under direct vision. Throughout the procedure,                        the patient's blood pressure, pulse, and oxygen                        saturations were monitored continuously. The Endoscope                        was introduced through the mouth, and advanced to the                        second part of duodenum. The upper GI endoscopy was                        accomplished without difficulty. The patient tolerated                        the procedure well. Findings:      Non-bleeding grade I varices were found in the middle third of the       esophagus, 25 cm from the incisors. No stigmata of recent bleeding were       evident and no red wale signs were present. GEJ 30cm, an 8cm hiatal       hernia was seen. No significant ring or stenosis at the gastroesophageal       junction.      Diffuse moderate inflammation characterized by erythema and granularity       was found in the gastric antrum. Biopsies were taken with a cold forceps       for histology. Biopsies were taken with a cold forceps for Helicobacter    pylori testing.      Localized mildly erythematous mucosa  without active bleeding and with no       stigmata of bleeding was found in the second portion of the duodenum.       Biopsies were taken with a cold forceps for histology. Could be a flat       polyp on inner turn of 2ed portion of duodenum. Impression:           - Non-bleeding grade I esophageal varices.                       - Gastritis. Biopsied.                       - Erythematous duodenopathy. Biopsied. Recommendation:       - Await pathology results. Manya Silvas, MD 04/13/2016 12:04:49 PM This report has been signed electronically. Number of Addenda: 0 Note Initiated On: 04/13/2016 11:35 AM      [facility]

## 2016-04-13 NOTE — Anesthesia Preprocedure Evaluation (Signed)
Anesthesia Evaluation  Patient identified by MRN, date of birth, ID band Patient awake    History of Anesthesia Complications (+) PONV  Airway Mallampati: III       Dental  (+) Upper Dentures   Pulmonary former smoker,    breath sounds clear to auscultation       Cardiovascular Exercise Tolerance: Good + CAD, + Past MI and + Cardiac Stents   Rhythm:Regular     Neuro/Psych    GI/Hepatic Neg liver ROS, GERD  Medicated,  Endo/Other  negative endocrine ROS  Renal/GU negative Renal ROS     Musculoskeletal   Abdominal Normal abdominal exam  (+)   Peds  Hematology negative hematology ROS (+)   Anesthesia Other Findings   Reproductive/Obstetrics                             Anesthesia Physical Anesthesia Plan  ASA: III  Anesthesia Plan: General   Post-op Pain Management:    Induction: Intravenous  Airway Management Planned: Natural Airway and Nasal Cannula  Additional Equipment:   Intra-op Plan:   Post-operative Plan:   Informed Consent: I have reviewed the patients History and Physical, chart, labs and discussed the procedure including the risks, benefits and alternatives for the proposed anesthesia with the patient or authorized representative who has indicated his/her understanding and acceptance.     Plan Discussed with: CRNA and Surgeon  Anesthesia Plan Comments:         Anesthesia Quick Evaluation

## 2016-04-13 NOTE — H&P (Signed)
Primary Care Physician:  Tracie Harrier, MD Primary Gastroenterologist:  Dr. Vira Agar  Pre-Procedure History & Physical: HPI:  Amber Sanford is a 81 y.o. female is here for an endoscopy.   Past Medical History:  Diagnosis Date  . Acute MI, anterior wall ([place]) 2004   Hx of   . Arthritis    knees, Left hip  . Carpal tunnel syndrome on right    Right middle finger mass  . Clostridium difficile colitis 2010   HX OF  . Complication of anesthesia    BP dropped after hip replacement - sent to ICU  . Coronary artery disease   . Full dentures    upper and lower  . GERD (gastroesophageal reflux disease)   . Headache    migraines in past, currently sinus HA  . Hearing loss in right ear   . Hyperlipidemia   . Motion sickness    circular motion  . Pinched nerve    hip  . Raynaud phenomenon   . Sigmoid diverticulosis   . Sinus infection 09/24/2014   started on 7 day Amoxicillin  . Stricture esophagus   . Vertigo    last episode "a few months ago"    Past Surgical History:  Procedure Laterality Date  . ABDOMINAL HYSTERECTOMY    . CARDIAC CATHETERIZATION  01/27/03   stent mid lad  . CARPAL TUNNEL RELEASE Right 10/03/2014   Procedure: OPEN CARPAL TUNNEL RELEASE;  Surgeon: Leanor Kail, MD;  Location: [place];  Service: Orthopedics;  Laterality: Right;  . COLONOSCOPY    . CORONARY ANGIOPLASTY WITH STENT PLACEMENT  02/13/09   mid lad  . ESOPHAGOGASTRODUODENOSCOPY (EGD) WITH ESOPHAGEAL DILATION     x7  . EYE SURGERY Right    retinal detachment  . FINGER SURGERY Left 06/19/2007   release flexor sheath left thumb and left ring finger  . JOINT REPLACEMENT Right 11/25/2009  . KNEE ARTHROSCOPY    . MASS EXCISION Right 10/03/2014   Procedure: MINOR EXCISION OF GANGLION MIDDLE FINGER;  Surgeon: Leanor Kail, MD;  Location: [place];  Service: Orthopedics;  Laterality: Right;  . RETINAL DETACHMENT SURGERY Right   . TONSILLECTOMY    . TOTAL HIP  ARTHROPLASTY Right 11/25/09  . VARICOSE VEIN SURGERY Bilateral     Prior to Admission medications   Medication Sig Start Date End Date Taking? Authorizing Provider  Acetaminophen (TYLENOL ARTHRITIS PAIN PO) Take by mouth.   Yes Historical Provider, MD  b complex vitamins tablet Take 1 tablet by mouth daily.   Yes Historical Provider, MD  Omeprazole-Sodium Bicarbonate (ZEGERID) 20-1100 MG CAPS capsule Take 1 capsule by mouth daily before breakfast.   Yes Historical Provider, MD  CRANBERRY PO Take by mouth. AM    Historical Provider, MD  loratadine (CLARITIN) 10 MG tablet Take 10 mg by mouth 2 (two) times daily.    Historical Provider, MD  Multiple Vitamin (MULTIVITAMIN) capsule Take 1 capsule by mouth daily. AM    Historical Provider, MD  Omega-3 Fatty Acids (FISH OIL PO) Take by mouth 2 (two) times daily.    Historical Provider, MD  ranitidine (ZANTAC) 150 MG capsule Take 150 mg by mouth 2 (two) times daily.    Historical Provider, MD  simvastatin (ZOCOR) 40 MG tablet Take 40 mg by mouth daily.    Historical Provider, MD  TURMERIC PO Take by mouth. AM    Historical Provider, MD    Allergies as of 04/08/2016 - Review Complete 10/03/2014  Allergen Reaction  Noted  . Lidocaine Other (See Comments) 09/24/2014  . Reglan [metoclopramide] Diarrhea 09/24/2014    History reviewed. No pertinent family history.  Social History   Social History  . Marital status: Widowed    Spouse name: N/A  . Number of children: N/A  . Years of education: N/A   Occupational History  . Not on file.   Social History Main Topics  . Smoking status: Former Smoker    Quit date: 10/19/1990  . Smokeless tobacco: Never Used  . Alcohol use No  . Drug use: No  . Sexual activity: Not on file   Other Topics Concern  . Not on file   Social History Narrative  . No narrative on file    Review of Systems: See HPI, otherwise negative ROS  Physical Exam: BP 137/70   Pulse 69   Temp (!) 96.5 F (35.8 C)  (Tympanic)   Resp 18   Ht 5\' 3"  (1.6 m)   Wt 59 kg (130 lb)   SpO2 100%   BMI 23.03 kg/m  General:   Alert,  pleasant and cooperative in NAD Head:  Normocephalic and atraumatic. Neck:  Supple; no masses or thyromegaly. Lungs:  Clear throughout to auscultation.    Heart:  Regular rate and rhythm. Abdomen:  Soft, nontender and nondistended. Normal bowel sounds, without guarding, and without rebound.   Neurologic:  Alert and  oriented x4;  grossly normal neurologically.  Impression/Plan: Amber Sanford is here for an endoscopy to be performed for dysphagia   Risks, benefits, limitations, and alternatives regarding  endoscopy have been reviewed with the patient.  Questions have been answered.  All parties agreeable.   Gaylyn Cheers, MD  04/13/2016, 11:34 AM

## 2016-04-13 NOTE — Anesthesia Post-op Follow-up Note (Cosign Needed)
Anesthesia QCDR form completed.        

## 2016-04-13 NOTE — Transfer of Care (Signed)
Immediate Anesthesia Transfer of Care Note  Patient: Amber Sanford  Procedure(s) Performed: Procedure(s): ESOPHAGOGASTRODUODENOSCOPY (EGD) WITH PROPOFOL (N/A) SAVORY DILATION (N/A)  Patient Location: PACU and Endoscopy Unit  Anesthesia Type:General  Level of Consciousness: sedated  Airway & Oxygen Therapy: Patient Spontanous Breathing and Patient connected to nasal cannula oxygen  Post-op Assessment: Report given to RN and Post -op Vital signs reviewed and stable  Post vital signs: Reviewed and stable  Last Vitals:  Vitals:   04/13/16 1036 04/13/16 1157  BP: 137/70   Pulse: 69 (!) 55  Resp: 18 10  Temp: (!) 35.8 C (!) 35.7 C    Last Pain:  Vitals:   04/13/16 1036  TempSrc: Tympanic         Complications: No apparent anesthesia complications

## 2016-04-14 ENCOUNTER — Encounter: Payer: Self-pay | Admitting: Unknown Physician Specialty

## 2016-04-14 LAB — SURGICAL PATHOLOGY

## 2016-04-19 DIAGNOSIS — K449 Diaphragmatic hernia without obstruction or gangrene: Secondary | ICD-10-CM | POA: Diagnosis not present

## 2016-04-19 DIAGNOSIS — Z955 Presence of coronary angioplasty implant and graft: Secondary | ICD-10-CM | POA: Diagnosis not present

## 2016-04-19 DIAGNOSIS — I2510 Atherosclerotic heart disease of native coronary artery without angina pectoris: Secondary | ICD-10-CM | POA: Diagnosis not present

## 2016-04-19 DIAGNOSIS — K219 Gastro-esophageal reflux disease without esophagitis: Secondary | ICD-10-CM | POA: Diagnosis not present

## 2016-04-19 DIAGNOSIS — R790 Abnormal level of blood mineral: Secondary | ICD-10-CM | POA: Diagnosis not present

## 2016-05-20 NOTE — Anesthesia Postprocedure Evaluation (Signed)
Anesthesia Post Note  Patient: Amber Sanford  Procedure(s) Performed: Procedure(s) (LRB): ESOPHAGOGASTRODUODENOSCOPY (EGD) WITH PROPOFOL (N/A) SAVORY DILATION (N/A)  Patient location during evaluation: PACU Anesthesia Type: General Level of consciousness: awake Pain management: pain level controlled Vital Signs Assessment: post-procedure vital signs reviewed and stable Respiratory status: nonlabored ventilation Cardiovascular status: stable Anesthetic complications: no     Last Vitals:  Vitals:   04/13/16 1228 04/13/16 1238  BP: 103/68 138/68  Pulse: 69 70  Resp: (!) 24 14  Temp:      Last Pain:  Vitals:   04/14/16 0744  TempSrc:   PainSc: 0-No pain                 VAN STAVEREN,Zayveon Raschke

## 2016-06-07 DIAGNOSIS — M26622 Arthralgia of left temporomandibular joint: Secondary | ICD-10-CM | POA: Diagnosis not present

## 2016-07-06 DIAGNOSIS — H90A22 Sensorineural hearing loss, unilateral, left ear, with restricted hearing on the contralateral side: Secondary | ICD-10-CM | POA: Diagnosis not present

## 2016-07-06 DIAGNOSIS — H903 Sensorineural hearing loss, bilateral: Secondary | ICD-10-CM | POA: Diagnosis not present

## 2016-07-06 DIAGNOSIS — H6122 Impacted cerumen, left ear: Secondary | ICD-10-CM | POA: Diagnosis not present

## 2016-10-14 DIAGNOSIS — I10 Essential (primary) hypertension: Secondary | ICD-10-CM | POA: Diagnosis not present

## 2016-10-14 DIAGNOSIS — Z955 Presence of coronary angioplasty implant and graft: Secondary | ICD-10-CM | POA: Diagnosis not present

## 2016-10-14 DIAGNOSIS — I2510 Atherosclerotic heart disease of native coronary artery without angina pectoris: Secondary | ICD-10-CM | POA: Diagnosis not present

## 2016-10-14 DIAGNOSIS — E785 Hyperlipidemia, unspecified: Secondary | ICD-10-CM | POA: Diagnosis not present

## 2016-10-27 DIAGNOSIS — K219 Gastro-esophageal reflux disease without esophagitis: Secondary | ICD-10-CM | POA: Diagnosis not present

## 2016-10-27 DIAGNOSIS — M25552 Pain in left hip: Secondary | ICD-10-CM | POA: Diagnosis not present

## 2016-10-27 DIAGNOSIS — E785 Hyperlipidemia, unspecified: Secondary | ICD-10-CM | POA: Diagnosis not present

## 2016-10-27 DIAGNOSIS — I2510 Atherosclerotic heart disease of native coronary artery without angina pectoris: Secondary | ICD-10-CM | POA: Diagnosis not present

## 2016-10-27 DIAGNOSIS — I10 Essential (primary) hypertension: Secondary | ICD-10-CM | POA: Diagnosis not present

## 2016-10-27 DIAGNOSIS — Z0000 Encounter for general adult medical examination without abnormal findings: Secondary | ICD-10-CM | POA: Diagnosis not present

## 2016-10-27 DIAGNOSIS — M159 Polyosteoarthritis, unspecified: Secondary | ICD-10-CM | POA: Diagnosis not present

## 2016-10-27 DIAGNOSIS — G8929 Other chronic pain: Secondary | ICD-10-CM | POA: Diagnosis not present

## 2017-01-30 DIAGNOSIS — J40 Bronchitis, not specified as acute or chronic: Secondary | ICD-10-CM | POA: Diagnosis not present

## 2017-01-30 DIAGNOSIS — J22 Unspecified acute lower respiratory infection: Secondary | ICD-10-CM | POA: Diagnosis not present

## 2017-03-02 DIAGNOSIS — I2510 Atherosclerotic heart disease of native coronary artery without angina pectoris: Secondary | ICD-10-CM | POA: Diagnosis not present

## 2017-03-02 DIAGNOSIS — I10 Essential (primary) hypertension: Secondary | ICD-10-CM | POA: Diagnosis not present

## 2017-03-02 DIAGNOSIS — M25552 Pain in left hip: Secondary | ICD-10-CM | POA: Diagnosis not present

## 2017-03-02 DIAGNOSIS — Z0000 Encounter for general adult medical examination without abnormal findings: Secondary | ICD-10-CM | POA: Diagnosis not present

## 2017-03-02 DIAGNOSIS — E785 Hyperlipidemia, unspecified: Secondary | ICD-10-CM | POA: Diagnosis not present

## 2017-03-02 DIAGNOSIS — G8929 Other chronic pain: Secondary | ICD-10-CM | POA: Diagnosis not present

## 2017-03-02 DIAGNOSIS — M159 Polyosteoarthritis, unspecified: Secondary | ICD-10-CM | POA: Diagnosis not present

## 2017-03-02 DIAGNOSIS — K219 Gastro-esophageal reflux disease without esophagitis: Secondary | ICD-10-CM | POA: Diagnosis not present

## 2017-03-06 DIAGNOSIS — L309 Dermatitis, unspecified: Secondary | ICD-10-CM | POA: Diagnosis not present

## 2017-03-06 DIAGNOSIS — I2510 Atherosclerotic heart disease of native coronary artery without angina pectoris: Secondary | ICD-10-CM | POA: Diagnosis not present

## 2017-03-06 DIAGNOSIS — Z0000 Encounter for general adult medical examination without abnormal findings: Secondary | ICD-10-CM | POA: Diagnosis not present

## 2017-03-06 DIAGNOSIS — I10 Essential (primary) hypertension: Secondary | ICD-10-CM | POA: Diagnosis not present

## 2017-03-06 DIAGNOSIS — E785 Hyperlipidemia, unspecified: Secondary | ICD-10-CM | POA: Diagnosis not present

## 2017-04-21 DIAGNOSIS — I10 Essential (primary) hypertension: Secondary | ICD-10-CM | POA: Diagnosis not present

## 2017-04-21 DIAGNOSIS — Z955 Presence of coronary angioplasty implant and graft: Secondary | ICD-10-CM | POA: Diagnosis not present

## 2017-04-21 DIAGNOSIS — I2510 Atherosclerotic heart disease of native coronary artery without angina pectoris: Secondary | ICD-10-CM | POA: Diagnosis not present

## 2017-04-21 DIAGNOSIS — E785 Hyperlipidemia, unspecified: Secondary | ICD-10-CM | POA: Diagnosis not present

## 2017-06-14 DIAGNOSIS — H353211 Exudative age-related macular degeneration, right eye, with active choroidal neovascularization: Secondary | ICD-10-CM | POA: Diagnosis not present

## 2017-06-14 DIAGNOSIS — H26499 Other secondary cataract, unspecified eye: Secondary | ICD-10-CM | POA: Diagnosis not present

## 2017-07-19 DIAGNOSIS — H353211 Exudative age-related macular degeneration, right eye, with active choroidal neovascularization: Secondary | ICD-10-CM | POA: Diagnosis not present

## 2017-08-23 DIAGNOSIS — H353211 Exudative age-related macular degeneration, right eye, with active choroidal neovascularization: Secondary | ICD-10-CM | POA: Diagnosis not present

## 2017-09-06 DIAGNOSIS — L309 Dermatitis, unspecified: Secondary | ICD-10-CM | POA: Diagnosis not present

## 2017-09-06 DIAGNOSIS — Z0000 Encounter for general adult medical examination without abnormal findings: Secondary | ICD-10-CM | POA: Diagnosis not present

## 2017-09-06 DIAGNOSIS — I2510 Atherosclerotic heart disease of native coronary artery without angina pectoris: Secondary | ICD-10-CM | POA: Diagnosis not present

## 2017-09-06 DIAGNOSIS — I10 Essential (primary) hypertension: Secondary | ICD-10-CM | POA: Diagnosis not present

## 2017-09-06 DIAGNOSIS — E785 Hyperlipidemia, unspecified: Secondary | ICD-10-CM | POA: Diagnosis not present

## 2017-09-11 DIAGNOSIS — M159 Polyosteoarthritis, unspecified: Secondary | ICD-10-CM | POA: Diagnosis not present

## 2017-09-11 DIAGNOSIS — I10 Essential (primary) hypertension: Secondary | ICD-10-CM | POA: Diagnosis not present

## 2017-09-11 DIAGNOSIS — R42 Dizziness and giddiness: Secondary | ICD-10-CM | POA: Diagnosis not present

## 2017-09-11 DIAGNOSIS — E785 Hyperlipidemia, unspecified: Secondary | ICD-10-CM | POA: Diagnosis not present

## 2017-09-11 DIAGNOSIS — I2510 Atherosclerotic heart disease of native coronary artery without angina pectoris: Secondary | ICD-10-CM | POA: Diagnosis not present

## 2017-09-11 DIAGNOSIS — Z0000 Encounter for general adult medical examination without abnormal findings: Secondary | ICD-10-CM | POA: Diagnosis not present

## 2017-09-13 DIAGNOSIS — E785 Hyperlipidemia, unspecified: Secondary | ICD-10-CM | POA: Diagnosis not present

## 2017-09-13 DIAGNOSIS — I2510 Atherosclerotic heart disease of native coronary artery without angina pectoris: Secondary | ICD-10-CM | POA: Diagnosis not present

## 2017-09-13 DIAGNOSIS — I10 Essential (primary) hypertension: Secondary | ICD-10-CM | POA: Diagnosis not present

## 2017-09-13 DIAGNOSIS — Z955 Presence of coronary angioplasty implant and graft: Secondary | ICD-10-CM | POA: Diagnosis not present

## 2017-09-27 DIAGNOSIS — H353211 Exudative age-related macular degeneration, right eye, with active choroidal neovascularization: Secondary | ICD-10-CM | POA: Diagnosis not present

## 2017-10-06 DIAGNOSIS — I10 Essential (primary) hypertension: Secondary | ICD-10-CM | POA: Diagnosis not present

## 2017-10-06 DIAGNOSIS — E785 Hyperlipidemia, unspecified: Secondary | ICD-10-CM | POA: Diagnosis not present

## 2017-10-06 DIAGNOSIS — I2510 Atherosclerotic heart disease of native coronary artery without angina pectoris: Secondary | ICD-10-CM | POA: Diagnosis not present

## 2017-10-06 DIAGNOSIS — Z955 Presence of coronary angioplasty implant and graft: Secondary | ICD-10-CM | POA: Diagnosis not present

## 2017-10-21 IMAGING — RF DG ESOPHAGUS
7 of 9 series · 13 of 18 positions shown · non-contrast
Comparison: None.

CLINICAL DATA: Dysphagia, esophagus stretched 7 times.

EXAM:
ESOPHOGRAM / BARIUM SWALLOW / BARIUM TABLET STUDY
TECHNIQUE: Combined double contrast and single contrast examination performed
using effervescent crystals, thick barium liquid, and thin barium
liquid. The patient was observed with fluoroscopy swallowing a 13 mm
barium sulphate tablet.
FLUOROSCOPY TIME:  Fluoroscopy Time:  0.8 minutes
Radiation Exposure Index (if provided by the fluoroscopic device):
9.1 mGy
Number of Acquired Spot Images: 0

[Series 1: cp_standard · 0.51mm/px · 3 of 48 frames shown (1 of 7)]
[frame 8/48]
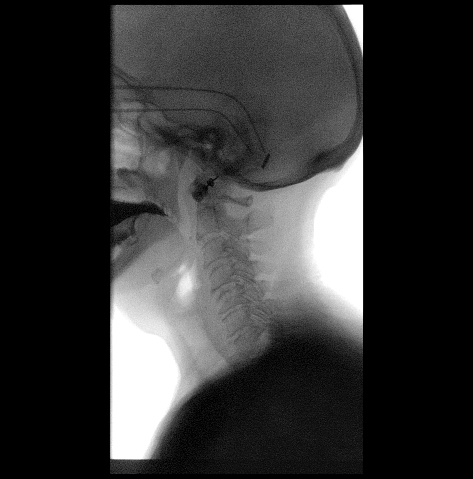
[frame 35/48]
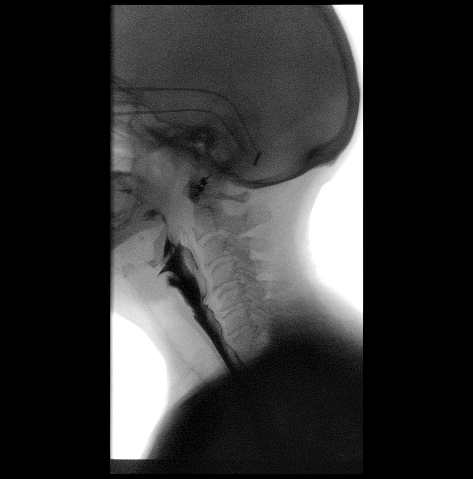
[frame 41/48]
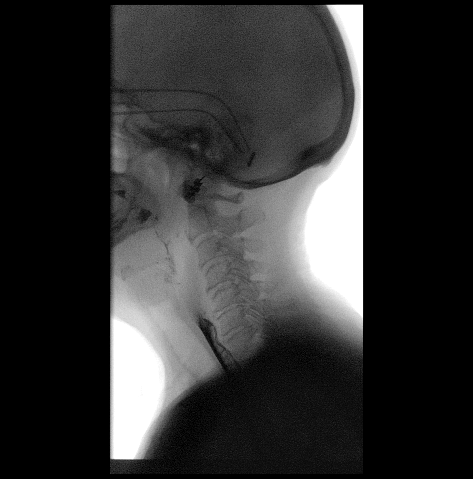

[Series 2: cp_standard · 0.51mm/px · 3 of 143 frames shown (2 of 7)]
[frame 16/143]
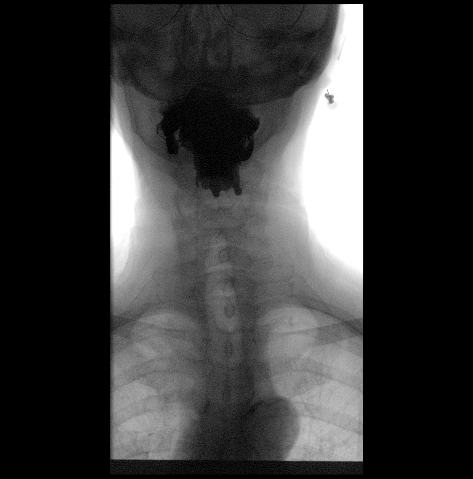
[frame 72/143]
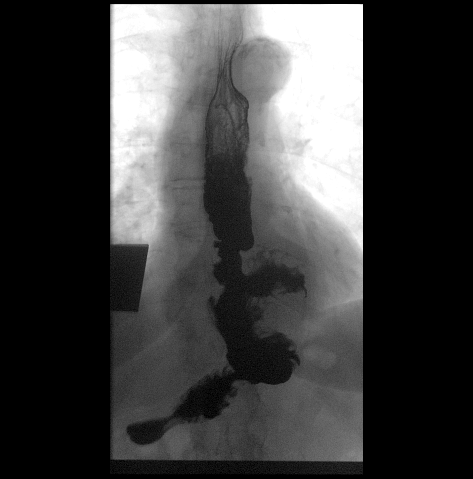
[frame 122/143]
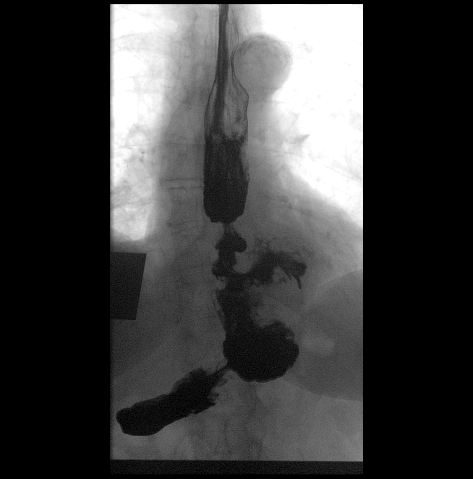

[Series 4: cp_standard · 0.26mm/px · 1 of 1 slices shown (3 of 7)]
[im 1/1]
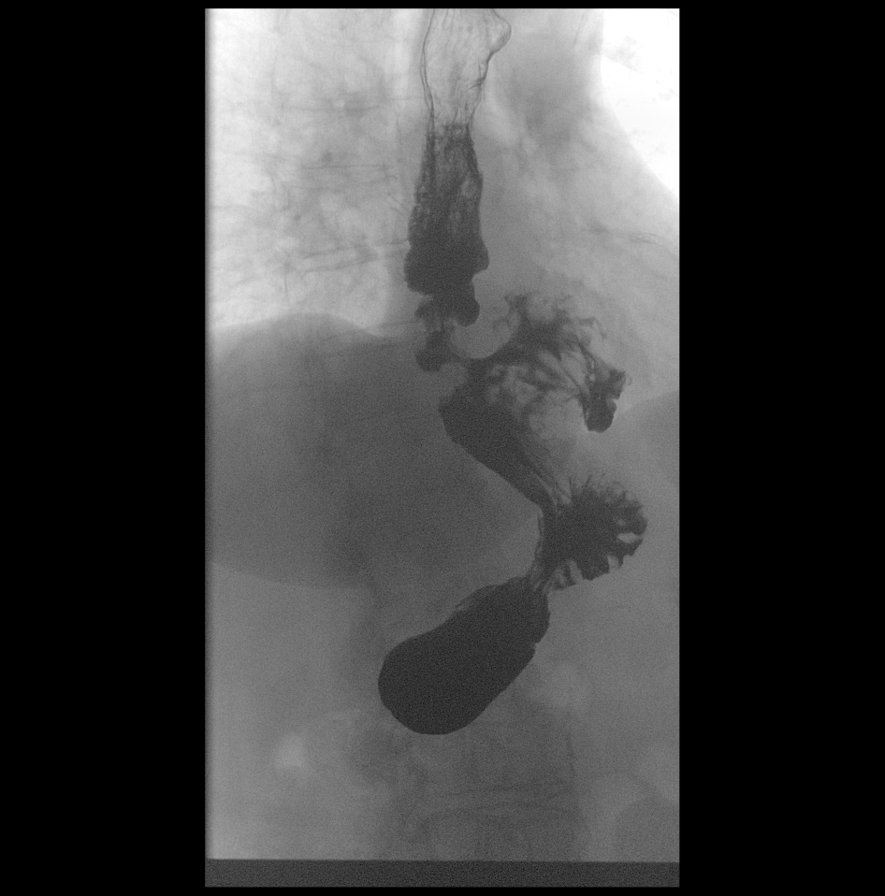

[Series 5: cp_standard · 0.26mm/px · 1 of 1 slices shown (4 of 7)]
[im 1/1]
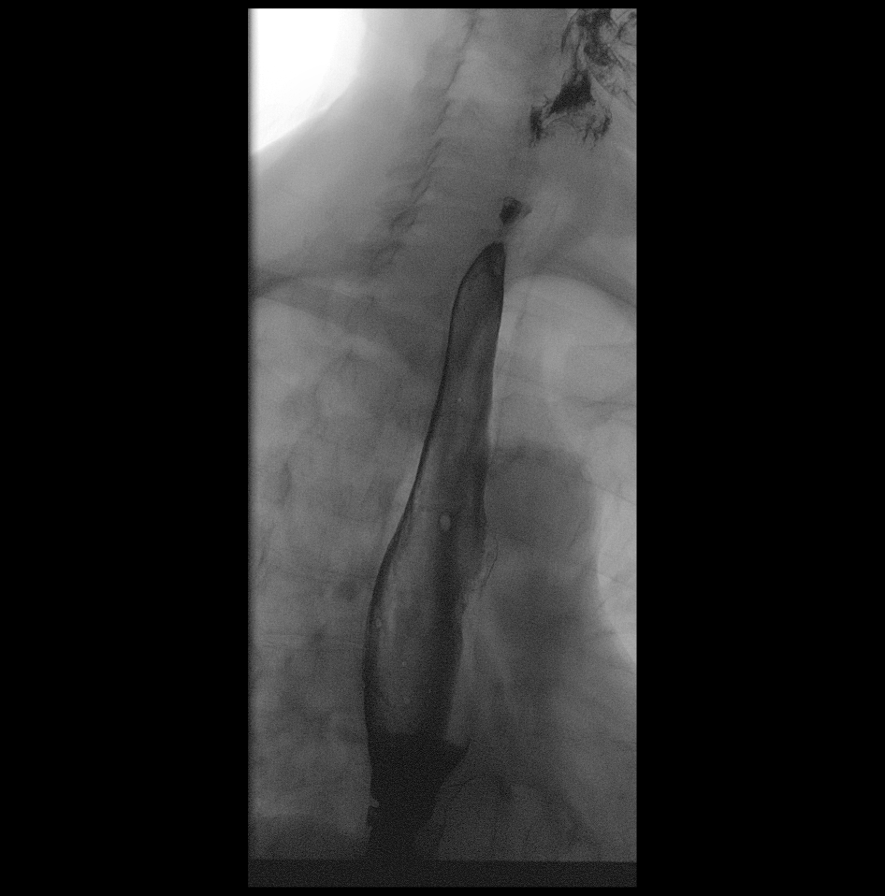

[Series 6: cp_standard · 0.52mm/px · 3 of 50 frames shown (5 of 7)]
[frame 4/50]
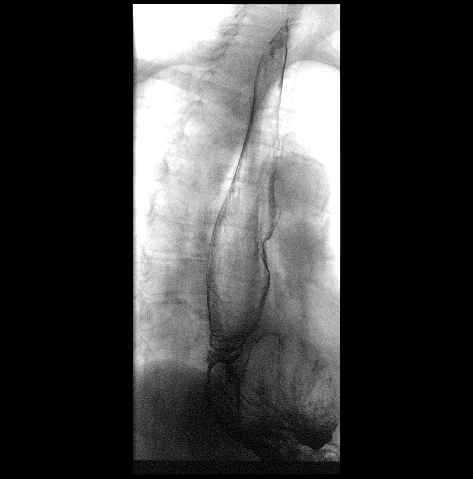
[frame 26/50]
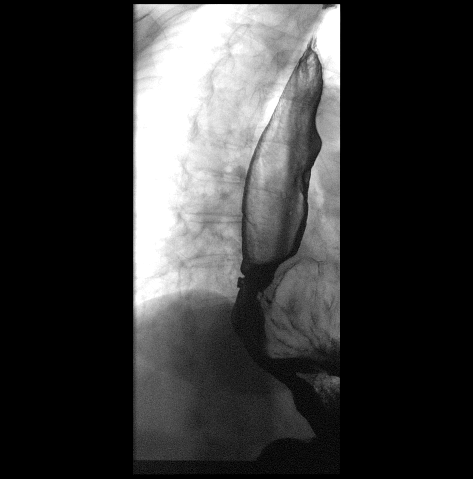
[frame 43/50]
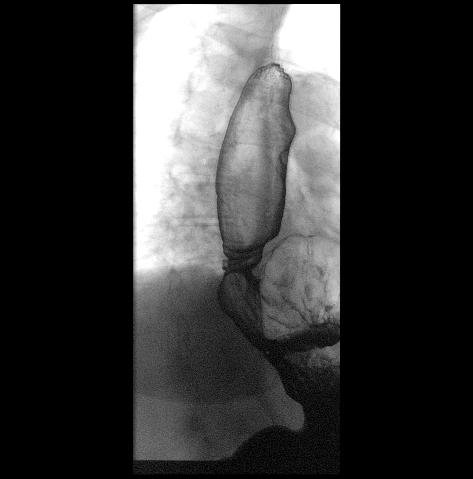

[Series 7: cp_standard · 0.26mm/px · 1 of 1 slices shown (6 of 7)]
[im 1/1]
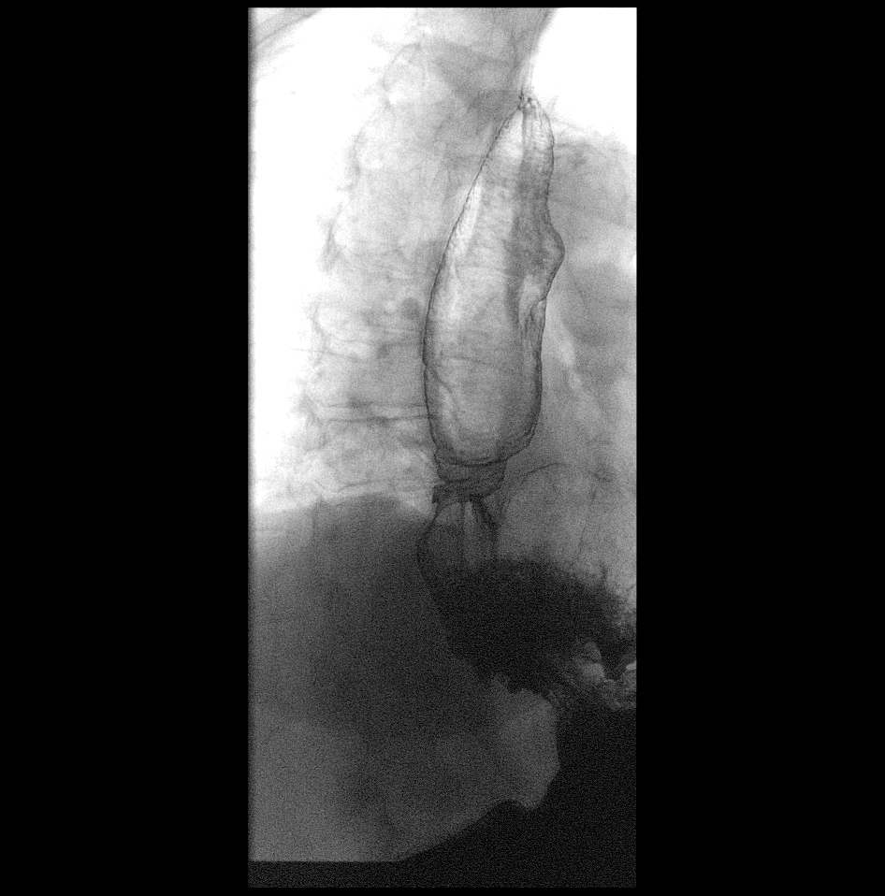

[Series 9: cp_standard · 0.27mm/px · 1 of 1 slices shown (7 of 7)]
[im 1/1]
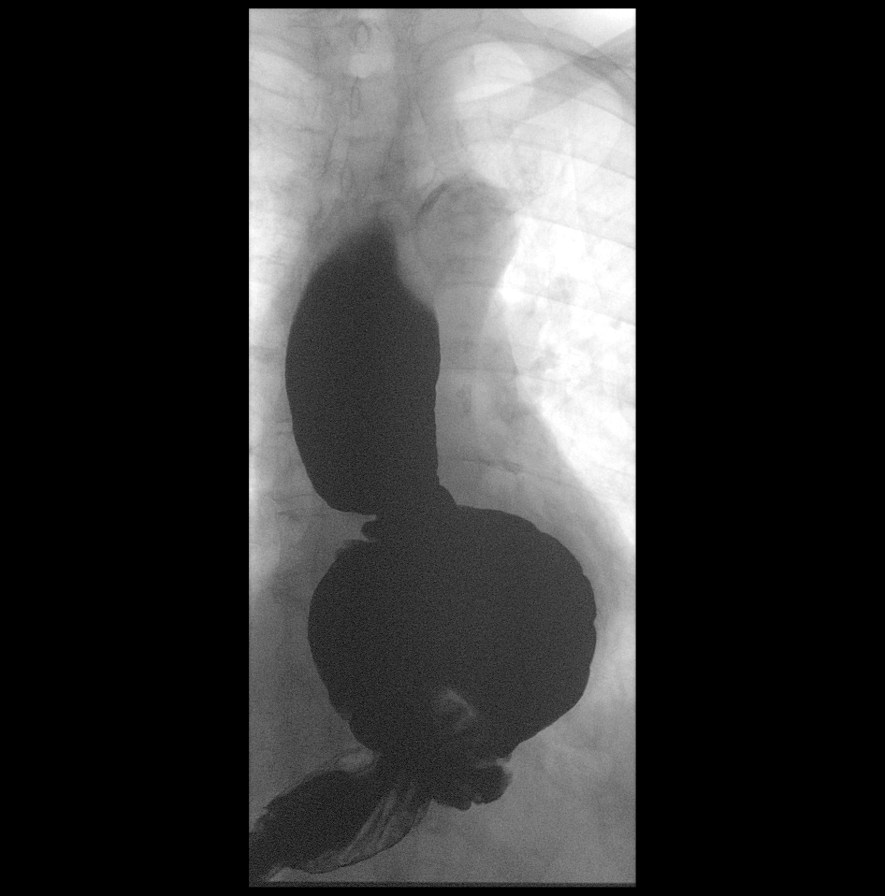

[13 of 18 positions shown; findings below may reference images not displayed]

FINDINGS: There was normal pharyngeal anatomy and motility. Contrast flowed
freely through the esophagus without evidence of stricture or mass.
There was normal esophageal mucosa without evidence of irregularity
or ulceration. There are intermittent distal esophageal spasms.
There is mild relative narrowing of the distal esophagus at the
esophagogastric junction without significant narrowing to restrict
the passage of a barium tablet. There is severe gastroesophageal
reflux. There is a large hiatal hernia.

At the end of the examination a 13 mm barium tablet was administered
which transited through the esophagus and esophagogastric junction
without delay.
IMPRESSION: Intermittent distal esophageal spasms.

Relative narrowing of the distal esophagus at the esophagogastric
junction without significant narrowing to restrict the passage of a
barium tablet.

Severe gastroesophageal reflux.

Large hiatal hernia.

## 2017-11-01 DIAGNOSIS — H353211 Exudative age-related macular degeneration, right eye, with active choroidal neovascularization: Secondary | ICD-10-CM | POA: Diagnosis not present

## 2017-12-13 DIAGNOSIS — H353211 Exudative age-related macular degeneration, right eye, with active choroidal neovascularization: Secondary | ICD-10-CM | POA: Diagnosis not present

## 2018-01-24 DIAGNOSIS — H353211 Exudative age-related macular degeneration, right eye, with active choroidal neovascularization: Secondary | ICD-10-CM | POA: Diagnosis not present

## 2018-03-12 DIAGNOSIS — G8929 Other chronic pain: Secondary | ICD-10-CM | POA: Diagnosis not present

## 2018-03-12 DIAGNOSIS — M1612 Unilateral primary osteoarthritis, left hip: Secondary | ICD-10-CM | POA: Diagnosis not present

## 2018-03-12 DIAGNOSIS — M159 Polyosteoarthritis, unspecified: Secondary | ICD-10-CM | POA: Diagnosis not present

## 2018-03-12 DIAGNOSIS — R42 Dizziness and giddiness: Secondary | ICD-10-CM | POA: Diagnosis not present

## 2018-03-12 DIAGNOSIS — I2510 Atherosclerotic heart disease of native coronary artery without angina pectoris: Secondary | ICD-10-CM | POA: Diagnosis not present

## 2018-03-12 DIAGNOSIS — I10 Essential (primary) hypertension: Secondary | ICD-10-CM | POA: Diagnosis not present

## 2018-03-12 DIAGNOSIS — M5441 Lumbago with sciatica, right side: Secondary | ICD-10-CM | POA: Diagnosis not present

## 2018-03-20 DIAGNOSIS — H353211 Exudative age-related macular degeneration, right eye, with active choroidal neovascularization: Secondary | ICD-10-CM | POA: Diagnosis not present

## 2018-04-04 DIAGNOSIS — E785 Hyperlipidemia, unspecified: Secondary | ICD-10-CM | POA: Diagnosis not present

## 2018-04-04 DIAGNOSIS — I2510 Atherosclerotic heart disease of native coronary artery without angina pectoris: Secondary | ICD-10-CM | POA: Diagnosis not present

## 2018-04-04 DIAGNOSIS — I10 Essential (primary) hypertension: Secondary | ICD-10-CM | POA: Diagnosis not present

## 2018-04-26 DIAGNOSIS — H353211 Exudative age-related macular degeneration, right eye, with active choroidal neovascularization: Secondary | ICD-10-CM | POA: Diagnosis not present

## 2018-06-07 DIAGNOSIS — H353211 Exudative age-related macular degeneration, right eye, with active choroidal neovascularization: Secondary | ICD-10-CM | POA: Diagnosis not present

## 2018-07-15 ENCOUNTER — Emergency Department: Payer: PPO

## 2018-07-15 ENCOUNTER — Other Ambulatory Visit: Payer: Self-pay

## 2018-07-15 ENCOUNTER — Emergency Department
Admission: EM | Admit: 2018-07-15 | Discharge: 2018-07-15 | Disposition: A | Discharge status: Home / Self Care | Payer: PPO | Attending: Emergency Medicine | Admitting: Emergency Medicine

## 2018-07-15 DIAGNOSIS — A09 Infectious gastroenteritis and colitis, unspecified: Secondary | ICD-10-CM | POA: Diagnosis not present

## 2018-07-15 DIAGNOSIS — Z96641 Presence of right artificial hip joint: Secondary | ICD-10-CM | POA: Diagnosis not present

## 2018-07-15 DIAGNOSIS — Z79899 Other long term (current) drug therapy: Secondary | ICD-10-CM | POA: Diagnosis not present

## 2018-07-15 DIAGNOSIS — R197 Diarrhea, unspecified: Secondary | ICD-10-CM

## 2018-07-15 DIAGNOSIS — R1030 Lower abdominal pain, unspecified: Secondary | ICD-10-CM | POA: Insufficient documentation

## 2018-07-15 DIAGNOSIS — I2510 Atherosclerotic heart disease of native coronary artery without angina pectoris: Secondary | ICD-10-CM | POA: Insufficient documentation

## 2018-07-15 DIAGNOSIS — Z87891 Personal history of nicotine dependence: Secondary | ICD-10-CM | POA: Diagnosis not present

## 2018-07-15 LAB — COMPREHENSIVE METABOLIC PANEL
ALT: 12 U/L (ref 0–44)
AST: 21 U/L (ref 15–41)
Albumin: 3.9 g/dL (ref 3.5–5.0)
Alkaline Phosphatase: 61 U/L (ref 38–126)
Anion gap: 10 (ref 5–15)
BUN: 11 mg/dL (ref 8–23)
CO2: 23 mmol/L (ref 22–32)
Calcium: 8.9 mg/dL (ref 8.9–10.3)
Chloride: 104 mmol/L (ref 98–111)
Creatinine, Ser: 0.68 mg/dL (ref 0.44–1.00)
GFR calc Af Amer: >60 mL/min (ref >60)
GFR calc non Af Amer: >60 mL/min (ref >60)
Glucose, Bld: 101 mg/dL — ABNORMAL HIGH (ref 70–99)
Potassium: 3.2 mmol/L — ABNORMAL LOW (ref 3.5–5.1)
Sodium: 137 mmol/L (ref 135–145)
Total Bilirubin: 1.7 mg/dL — ABNORMAL HIGH (ref 0.3–1.2)
Total Protein: 7.5 g/dL (ref 6.5–8.1)

## 2018-07-15 LAB — CBC WITH DIFFERENTIAL/PLATELET
Abs Immature Granulocytes: 0 10*3/uL (ref 0.00–0.07)
Basophils Absolute: 0 10*3/uL (ref 0.0–0.1)
Basophils Relative: 1 %
Eosinophils Absolute: 0.1 10*3/uL (ref 0.0–0.5)
Eosinophils Relative: 3 %
HCT: 38.7 % (ref 36.0–46.0)
Hemoglobin: 12.6 g/dL (ref 12.0–15.0)
Immature Granulocytes: 0 %
Lymphocytes Relative: 30 %
Lymphs Abs: 1.3 10*3/uL (ref 0.7–4.0)
MCH: 31.3 pg (ref 26.0–34.0)
MCHC: 32.6 g/dL (ref 30.0–36.0)
MCV: 96 fL (ref 80.0–100.0)
Monocytes Absolute: 0.7 10*3/uL (ref 0.1–1.0)
Monocytes Relative: 15 %
Neutro Abs: 2.2 10*3/uL (ref 1.7–7.7)
Neutrophils Relative %: 51 %
Platelets: 208 10*3/uL (ref 150–400)
RBC: 4.03 MIL/uL (ref 3.87–5.11)
RDW: 14 % (ref 11.5–15.5)
WBC: 4.2 10*3/uL (ref 4.0–10.5)
nRBC: 0 % (ref 0.0–0.2)

## 2018-07-15 LAB — C DIFFICILE QUICK SCREEN W PCR REFLEX
C Diff interpretation: NOT DETECTED
C Diff toxin: NEGATIVE

## 2018-07-15 LAB — C DIFFICILE QUICK SCREEN W PCR REFLEX??: C Diff antigen: NEGATIVE

## 2018-07-15 LAB — MAGNESIUM: Magnesium: 1.9 mg/dL (ref 1.7–2.4)

## 2018-07-15 MED ORDER — POTASSIUM CHLORIDE 20 MEQ PO PACK
40.0000 meq | PACK | Freq: Once | ORAL | Status: AC
  Administered 2018-07-15: 40 meq via ORAL
  Filled 2018-07-15: qty 2

## 2018-07-15 MED ORDER — FENTANYL CITRATE (PF) 100 MCG/2ML IJ SOLN
50.0000 ug | Freq: Once | INTRAMUSCULAR | Status: AC
  Administered 2018-07-15: 50 ug via INTRAVENOUS
  Filled 2018-07-15: qty 2

## 2018-07-15 MED ORDER — LOPERAMIDE HCL 2 MG PO TABS: 2.0000 mg | ORAL_TABLET | Freq: Four times a day (QID) | ORAL | 0 refills | Status: AC | PRN

## 2018-07-15 MED ORDER — ONDANSETRON HCL 4 MG/2ML IJ SOLN
4.0000 mg | Freq: Once | INTRAMUSCULAR | Status: AC
  Administered 2018-07-15: 4 mg via INTRAVENOUS
  Filled 2018-07-15: qty 2

## 2018-07-15 MED ORDER — SODIUM CHLORIDE 0.9 % IV BOLUS
1000.0000 mL | Freq: Once | INTRAVENOUS | Status: AC
  Administered 2018-07-15: 1000 mL via INTRAVENOUS

## 2018-07-15 MED ORDER — IOHEXOL 300 MG/ML  SOLN
100.0000 mL | Freq: Once | INTRAMUSCULAR | Status: AC | PRN
  Administered 2018-07-15: 100 mL via INTRAVENOUS

## 2018-07-15 NOTE — ED Notes (Signed)
Spoke with patient's daughter [employer] to inform of test results and pending discharge. Daughter states she will come to ED to pick patient up.

## 2018-07-15 NOTE — ED Notes (Signed)
Patient transported to CT 

## 2018-07-15 NOTE — ED Provider Notes (Signed)
[facility] Emergency Department Provider Note  ____________________________________________  Time seen: Approximately 10:57 AM  I have reviewed the triage vital signs and the nursing notes.   HISTORY  Chief Complaint Diarrhea and Nausea   HPI Amber Sanford is a 83 y.o. female history of CAD, GERD, hyperlipidemia, diverticulosis who presents for evaluation of diarrhea.  Patient reports 1 week of several daily episodes of diarrhea.  Patient reports having bad diarrhea once in her life several years ago.  She reports that she had to be separated from her family members with contact precautions but she does not remember which bacteria she had.  No recent antibiotic use.  She is also complaining of 2 out of 10 cramping lower abdominal pain which she has had for a week.  She comes in today because her symptoms are not getting better.  She has tried over the counter antidiarrheal medications with no significant relief.  She denies fever or chills.  She has had nausea and one episode of vomiting a few days ago.  No chest pain or shortness of breath.  Past Medical History:  Diagnosis Date   Acute MI, anterior wall ([place]) 2004   Hx of    Arthritis    knees, Left hip   Carpal tunnel syndrome on right    Right middle finger mass   Clostridium difficile colitis 1610   HX OF   Complication of anesthesia    BP dropped after hip replacement - sent to ICU   Coronary artery disease    Full dentures    upper and lower   GERD (gastroesophageal reflux disease)    Headache    migraines in past, currently sinus HA   Hearing loss in right ear    Hyperlipidemia    Motion sickness    circular motion   Pinched nerve    hip   Raynaud phenomenon    Sigmoid diverticulosis    Sinus infection 09/24/2014   started on 7 day Amoxicillin   Stricture esophagus    Vertigo    last episode "a few months ago"    There are no active problems to display for this  patient.   Past Surgical History:  Procedure Laterality Date   ABDOMINAL HYSTERECTOMY     CARDIAC CATHETERIZATION  01/27/03   stent mid lad   CARPAL TUNNEL RELEASE Right 10/03/2014   Procedure: OPEN CARPAL TUNNEL RELEASE;  Surgeon: Leanor Kail, MD;  Location: [facility];  Service: Orthopedics;  Laterality: Right;   COLONOSCOPY     CORONARY ANGIOPLASTY WITH STENT PLACEMENT  02/13/09   mid lad   ESOPHAGOGASTRODUODENOSCOPY (EGD) WITH ESOPHAGEAL DILATION     x7   ESOPHAGOGASTRODUODENOSCOPY (EGD) WITH PROPOFOL N/A 04/13/2016   Procedure: ESOPHAGOGASTRODUODENOSCOPY (EGD) WITH PROPOFOL;  Surgeon: Manya Silvas, MD;  Location: [facility];  Service: Endoscopy;  Laterality: N/A;   EYE SURGERY Right    retinal detachment   FINGER SURGERY Left 06/19/2007   release flexor sheath left thumb and left ring finger   JOINT REPLACEMENT Right 11/25/2009   KNEE ARTHROSCOPY     MASS EXCISION Right 10/03/2014   Procedure: MINOR EXCISION OF GANGLION MIDDLE FINGER;  Surgeon: Leanor Kail, MD;  Location: [place];  Service: Orthopedics;  Laterality: Right;   RETINAL DETACHMENT SURGERY Right    SAVORY DILATION N/A 04/13/2016   Procedure: SAVORY DILATION;  Surgeon: Manya Silvas, MD;  Location: [facility] ENDOSCOPY;  Service: Endoscopy;  Laterality: N/A;   TONSILLECTOMY  TOTAL HIP ARTHROPLASTY Right 11/25/09   VARICOSE VEIN SURGERY Bilateral     Prior to Admission medications   Medication Sig Start Date End Date Taking? Authorizing Provider  Acetaminophen (TYLENOL ARTHRITIS PAIN PO) Take by mouth.    [provider]  b complex vitamins tablet Take 1 tablet by mouth daily.    [provider]  CRANBERRY PO Take by mouth. AM    [provider]  loperamide (IMODIUM A-D) 2 MG tablet Take 1 tablet (2 mg total) by mouth 4 (four) times daily as needed for diarrhea or loose stools. 07/15/18   Rudene Re, MD  loratadine (CLARITIN) 10 MG  tablet Take 10 mg by mouth 2 (two) times daily.    [provider]  Multiple Vitamin (MULTIVITAMIN) capsule Take 1 capsule by mouth daily. AM    [provider]  Omega-3 Fatty Acids (FISH OIL PO) Take by mouth 2 (two) times daily.    [provider]  Omeprazole-Sodium Bicarbonate (ZEGERID) 20-1100 MG CAPS capsule Take 1 capsule by mouth daily before breakfast.    [provider]  ranitidine (ZANTAC) 150 MG capsule Take 150 mg by mouth 2 (two) times daily.    [provider]  simvastatin (ZOCOR) 40 MG tablet Take 40 mg by mouth daily.    [provider]  TURMERIC PO Take by mouth. AM    [provider]    Allergies Lidocaine and Reglan [metoclopramide]  History reviewed. No pertinent family history.  Social History Social History   Tobacco Use   Smoking status: Former Smoker    Last attempt to quit: 10/19/1990    Years since quitting: 27.7   Smokeless tobacco: Never Used  Substance Use Topics   Alcohol use: No   Drug use: No    Review of Systems  Constitutional: Negative for fever. Eyes: Negative for visual changes. ENT: Negative for sore throat. Neck: No neck pain  Cardiovascular: Negative for chest pain. Respiratory: Negative for shortness of breath. Gastrointestinal: + abdominal pain, vomiting and diarrhea. Genitourinary: Negative for dysuria. Musculoskeletal: Negative for back pain. Skin: Negative for rash. Neurological: Negative for headaches, weakness or numbness. Psych: No SI or HI  ____________________________________________   PHYSICAL EXAM:  VITAL SIGNS: ED Triage Vitals  Enc Vitals Group     BP 07/15/18 1043 (!) 109/55     Pulse Rate 07/15/18 1043 82     Resp 07/15/18 1043 12     Temp 07/15/18 1043 98.1 F (36.7 C)     Temp Source 07/15/18 1043 Oral     SpO2 07/15/18 1043 99 %     Weight 07/15/18 1048 130 lb (59 kg)     Height 07/15/18 1048 5\' 2"  (1.575 m)     Head Circumference --        Peak Flow --      Pain Score 07/15/18 1043 8     Pain Loc --      Pain Edu? --      Excl. in [place]? --     Constitutional: Alert and oriented. Well appearing and in no apparent distress. HEENT:      Head: Normocephalic and atraumatic.         Eyes: Conjunctivae are normal. Sclera is non-icteric.       Mouth/Throat: Mucous membranes are moist.       Neck: Supple with no signs of meningismus. Cardiovascular: Regular rate and rhythm. No murmurs, gallops, or rubs. 2+ symmetrical distal pulses are present in  all extremities. No JVD. Respiratory: Normal respiratory effort. Lungs are clear to auscultation bilaterally. No wheezes, crackles, or rhonchi.  Gastrointestinal: Soft, diffuse tenderness to palpation over the lower quadrants, and non distended with positive bowel sounds. No rebound or guarding. Genitourinary: No CVA tenderness. Musculoskeletal: Nontender with normal range of motion in all extremities. No edema, cyanosis, or erythema of extremities. Neurologic: Normal speech and language. Face is symmetric. Moving all extremities. No gross focal neurologic deficits are appreciated. Skin: Skin is warm, dry and intact. No rash noted. Psychiatric: Mood and affect are normal. Speech and behavior are normal.  ____________________________________________   LABS (all labs ordered are listed, but only abnormal results are displayed)  Labs Reviewed  COMPREHENSIVE METABOLIC PANEL - Abnormal; Notable for the following components:      Result Value   Potassium 3.2 (*)    Glucose, Bld 101 (*)    Total Bilirubin 1.7 (*)    All other components within normal limits  C DIFFICILE QUICK SCREEN W PCR REFLEX  CBC WITH DIFFERENTIAL/PLATELET  MAGNESIUM   ____________________________________________  EKG  none  ____________________________________________  RADIOLOGY  I have personally reviewed the images performed during this visit and I agree with the Radiologist's  read.   Interpretation by Radiologist:  Ct Abdomen Pelvis W Contrast  Result Date: 07/15/2018 CLINICAL DATA:  Diarrhea for 1 week. Nausea. History of Clostridium difficile colitis. EXAM: CT ABDOMEN AND PELVIS WITH CONTRAST TECHNIQUE: Multidetector CT imaging of the abdomen and pelvis was performed using the standard protocol following bolus administration of intravenous contrast. CONTRAST:  110mL OMNIPAQUE IOHEXOL 300 MG/ML  SOLN COMPARISON:  Abdominopelvic CT 01/25/2009. Lumbar MRI 01/25/2011. Right hip radiographs 11/25/2009. FINDINGS: Lower chest: Stable mild subpleural reticulation at both lung bases. There is a moderate-sized hiatal hernia with a paraesophageal component, similar to the previous study. The distal esophagus is dilated. There is extensive atherosclerosis of the aorta and coronary arteries. There are probable aortic valvular calcifications. Hepatobiliary: The liver is normal in density without suspicious focal abnormality. There are multiple small gallstones. The gallbladder is mildly distended without wall thickening or surrounding inflammation. There is no biliary dilatation. Pancreas: Unremarkable. No pancreatic ductal dilatation or surrounding inflammatory changes. Spleen: There are stable small splenic cysts. No splenomegaly or suspicious focal finding. Adrenals/Urinary Tract: Both adrenal glands appear normal. There are bilateral renal cysts, largest projecting posteriorly from the interpolar region of the left kidney, measuring 2.9 cm on image 45/2. No evidence of enhancing renal mass or hydronephrosis. There is pelvic floor laxity with a small cystocele. No focal bladder abnormality identified. Stomach/Bowel: No evidence of bowel wall thickening, distention or surrounding inflammatory change. Mild distal colonic diverticulosis. The appendix appears normal. As above, large hiatal hernia with a paraesophageal component. Vascular/Lymphatic: There are no enlarged abdominal or pelvic  lymph nodes. Diffuse aortic and branch vessel atherosclerosis. Reproductive: Hysterectomy.  No adnexal mass. Other: As above, prominent pelvic floor laxity. Small umbilical hernia containing only fat. No ascites. Musculoskeletal: No acute osseous findings. Interval right total hip arthroplasty. There is a large peripherally calcified soft tissue mass surrounding the right hip joint, measuring up to 11.0 x 8.7 x 9.3 cm. No definite resulting osseous erosion or prosthetic loosening. The distal end of the femoral prosthesis is incompletely visualized. There is advanced left hip osteoarthritis. There are degenerative changes throughout the lumbar spine. IMPRESSION: 1. No cause for diarrhea identified. There are diverticular changes of the distal colon, but no evidence of recurrent colitis. The appendix appears normal. 2. Large hiatal  hernia with paraesophageal component, similar to remote priors. 3. Cholelithiasis without evidence of cholecystitis or biliary dilatation. 4. Large peripherally calcified soft tissue mass associated with the right hip joint status post interval total hip arthroplasty. Appearance is most suggestive of a pseudomass related to particle disease, although there is no gross osseous erosion or prosthetic loosening. Other considerations include heterotopic ossification and synovitis. Appearance is not typical for synovial chondromatosis. Orthopedic follow-up recommended. 5. Pelvic floor laxity. 6. Severe Aortic Atherosclerosis (ICD10-I70.0). Electronically Signed   By: Richardean Sale M.D.   On: 07/15/2018 13:14      ____________________________________________   PROCEDURES  Procedure(s) performed: None Procedures Critical Care performed:  None ____________________________________________   INITIAL IMPRESSION / ASSESSMENT AND PLAN / ED COURSE  83 y.o. female history of CAD, GERD, hyperlipidemia, diverticulosis who presents for evaluation of diarrhea x 1 week and lower abdominal  pain.  Ddx colits, C. Diff, diverticulitis.  Patient is well-appearing with normal vital signs, abdomen is soft with diffuse lower quadrant tenderness with no rebound or guarding.  Plan for fentanyl, Zofran and fluids for symptom relief.  Will check CBC, CMP, magnesium, C. difficile and a CT abdomen pelvis    _________________________ 2:47 PM on 07/15/2018 -----------------------------------------  CT with no acute findings.  C. difficile negative.  Patient with no further episodes of diarrhea in the emergency room.  Will discharge home with a prescription for Imodium and close follow-up with primary care doctor.  Discussed my standard return precautions.   As part of my medical decision making, I reviewed the following data within the [place] [place] notes reviewed and incorporated, Labs reviewed , Old chart reviewed, Radiograph reviewed , Notes from prior ED visits and [place] Controlled Substance Database    Pertinent labs & imaging results that were available during my care of the patient were reviewed by me and considered in my medical decision making (see chart for details).    ____________________________________________   FINAL CLINICAL IMPRESSION(S) / ED DIAGNOSES  Final diagnoses:  Diarrhea of presumed infectious origin  Lower abdominal pain      NEW MEDICATIONS STARTED DURING THIS VISIT:  ED Discharge Orders         Ordered    loperamide (IMODIUM A-D) 2 MG tablet  4 times daily PRN     07/15/18 1420           Note:  This document was prepared using Dragon voice recognition software and may include unintentional dictation errors.    Alfred Levins, [place], MD 07/15/18 [phone]

## 2018-07-15 NOTE — ED Notes (Signed)
Verbal permission given by patient to speak with daughter Lubertha Sayres [phone]). Daughter updated on test results and plan of care.

## 2018-07-15 NOTE — ED Triage Notes (Signed)
Pt arrives to ed via ems from home, where she lives alone. EMS reports diarrhea x 7 days. Pt states 3 episodes today, currently nauseated. Pt reports taking anti diarrhea with no relief. Alert and oriented on arrival. Ambulatory from stretcher to bed. NAD noted at this time

## 2018-07-15 NOTE — ED Notes (Signed)
Pt transported home by daughter. This RN reviewed discharge paperwork and prescriptions with pt.

## 2018-07-15 NOTE — Discharge Instructions (Signed)

## 2018-07-31 DIAGNOSIS — H353211 Exudative age-related macular degeneration, right eye, with active choroidal neovascularization: Secondary | ICD-10-CM | POA: Diagnosis not present

## 2018-09-14 DIAGNOSIS — E785 Hyperlipidemia, unspecified: Secondary | ICD-10-CM | POA: Diagnosis not present

## 2018-09-14 DIAGNOSIS — R8290 Unspecified abnormal findings in urine: Secondary | ICD-10-CM | POA: Diagnosis not present

## 2018-09-14 DIAGNOSIS — I10 Essential (primary) hypertension: Secondary | ICD-10-CM | POA: Diagnosis not present

## 2018-09-14 DIAGNOSIS — M159 Polyosteoarthritis, unspecified: Secondary | ICD-10-CM | POA: Diagnosis not present

## 2018-09-14 DIAGNOSIS — I2510 Atherosclerotic heart disease of native coronary artery without angina pectoris: Secondary | ICD-10-CM | POA: Diagnosis not present

## 2018-09-14 DIAGNOSIS — G8929 Other chronic pain: Secondary | ICD-10-CM | POA: Diagnosis not present

## 2018-09-14 DIAGNOSIS — Z0000 Encounter for general adult medical examination without abnormal findings: Secondary | ICD-10-CM | POA: Diagnosis not present

## 2018-09-14 DIAGNOSIS — M1612 Unilateral primary osteoarthritis, left hip: Secondary | ICD-10-CM | POA: Diagnosis not present

## 2018-09-14 DIAGNOSIS — M5441 Lumbago with sciatica, right side: Secondary | ICD-10-CM | POA: Diagnosis not present

## 2018-10-03 DIAGNOSIS — H353211 Exudative age-related macular degeneration, right eye, with active choroidal neovascularization: Secondary | ICD-10-CM | POA: Diagnosis not present

## 2018-10-24 DIAGNOSIS — I10 Essential (primary) hypertension: Secondary | ICD-10-CM | POA: Diagnosis not present

## 2018-10-24 DIAGNOSIS — E785 Hyperlipidemia, unspecified: Secondary | ICD-10-CM | POA: Diagnosis not present

## 2018-10-24 DIAGNOSIS — I2510 Atherosclerotic heart disease of native coronary artery without angina pectoris: Secondary | ICD-10-CM | POA: Diagnosis not present

## 2018-10-24 DIAGNOSIS — Z955 Presence of coronary angioplasty implant and graft: Secondary | ICD-10-CM | POA: Diagnosis not present

## 2018-11-21 DIAGNOSIS — H353211 Exudative age-related macular degeneration, right eye, with active choroidal neovascularization: Secondary | ICD-10-CM | POA: Diagnosis not present

## 2019-01-16 DIAGNOSIS — H353211 Exudative age-related macular degeneration, right eye, with active choroidal neovascularization: Secondary | ICD-10-CM | POA: Diagnosis not present

## 2019-03-01 DIAGNOSIS — H00024 Hordeolum internum left upper eyelid: Secondary | ICD-10-CM | POA: Diagnosis not present

## 2019-03-12 DIAGNOSIS — Z0000 Encounter for general adult medical examination without abnormal findings: Secondary | ICD-10-CM | POA: Diagnosis not present

## 2019-03-12 DIAGNOSIS — M1612 Unilateral primary osteoarthritis, left hip: Secondary | ICD-10-CM | POA: Diagnosis not present

## 2019-03-12 DIAGNOSIS — I2510 Atherosclerotic heart disease of native coronary artery without angina pectoris: Secondary | ICD-10-CM | POA: Diagnosis not present

## 2019-03-12 DIAGNOSIS — E785 Hyperlipidemia, unspecified: Secondary | ICD-10-CM | POA: Diagnosis not present

## 2019-03-20 DIAGNOSIS — H353211 Exudative age-related macular degeneration, right eye, with active choroidal neovascularization: Secondary | ICD-10-CM | POA: Diagnosis not present

## 2019-04-03 DIAGNOSIS — Z955 Presence of coronary angioplasty implant and graft: Secondary | ICD-10-CM | POA: Diagnosis not present

## 2019-04-03 DIAGNOSIS — Z0000 Encounter for general adult medical examination without abnormal findings: Secondary | ICD-10-CM | POA: Diagnosis not present

## 2019-04-03 DIAGNOSIS — I2510 Atherosclerotic heart disease of native coronary artery without angina pectoris: Secondary | ICD-10-CM | POA: Diagnosis not present

## 2019-04-03 DIAGNOSIS — E785 Hyperlipidemia, unspecified: Secondary | ICD-10-CM | POA: Diagnosis not present

## 2019-04-03 DIAGNOSIS — I10 Essential (primary) hypertension: Secondary | ICD-10-CM | POA: Diagnosis not present

## 2019-04-03 DIAGNOSIS — M1612 Unilateral primary osteoarthritis, left hip: Secondary | ICD-10-CM | POA: Diagnosis not present

## 2019-05-20 DIAGNOSIS — Z955 Presence of coronary angioplasty implant and graft: Secondary | ICD-10-CM | POA: Diagnosis not present

## 2019-05-20 DIAGNOSIS — I10 Essential (primary) hypertension: Secondary | ICD-10-CM | POA: Diagnosis not present

## 2019-05-20 DIAGNOSIS — I2510 Atherosclerotic heart disease of native coronary artery without angina pectoris: Secondary | ICD-10-CM | POA: Diagnosis not present

## 2019-05-20 DIAGNOSIS — E785 Hyperlipidemia, unspecified: Secondary | ICD-10-CM | POA: Diagnosis not present

## 2019-05-20 DIAGNOSIS — I350 Nonrheumatic aortic (valve) stenosis: Secondary | ICD-10-CM | POA: Diagnosis not present

## 2019-05-22 DIAGNOSIS — H353211 Exudative age-related macular degeneration, right eye, with active choroidal neovascularization: Secondary | ICD-10-CM | POA: Diagnosis not present

## 2019-07-24 DIAGNOSIS — H353211 Exudative age-related macular degeneration, right eye, with active choroidal neovascularization: Secondary | ICD-10-CM | POA: Diagnosis not present

## 2019-09-24 DIAGNOSIS — E785 Hyperlipidemia, unspecified: Secondary | ICD-10-CM | POA: Diagnosis not present

## 2019-09-24 DIAGNOSIS — I10 Essential (primary) hypertension: Secondary | ICD-10-CM | POA: Diagnosis not present

## 2019-09-24 DIAGNOSIS — M1612 Unilateral primary osteoarthritis, left hip: Secondary | ICD-10-CM | POA: Diagnosis not present

## 2019-09-24 DIAGNOSIS — R8290 Unspecified abnormal findings in urine: Secondary | ICD-10-CM | POA: Diagnosis not present

## 2019-09-24 DIAGNOSIS — I2510 Atherosclerotic heart disease of native coronary artery without angina pectoris: Secondary | ICD-10-CM | POA: Diagnosis not present

## 2019-09-24 DIAGNOSIS — Z955 Presence of coronary angioplasty implant and graft: Secondary | ICD-10-CM | POA: Diagnosis not present

## 2019-09-25 DIAGNOSIS — H353211 Exudative age-related macular degeneration, right eye, with active choroidal neovascularization: Secondary | ICD-10-CM | POA: Diagnosis not present

## 2019-10-01 DIAGNOSIS — Z9189 Other specified personal risk factors, not elsewhere classified: Secondary | ICD-10-CM | POA: Diagnosis not present

## 2019-10-01 DIAGNOSIS — I350 Nonrheumatic aortic (valve) stenosis: Secondary | ICD-10-CM | POA: Diagnosis not present

## 2019-10-01 DIAGNOSIS — Z0000 Encounter for general adult medical examination without abnormal findings: Secondary | ICD-10-CM | POA: Diagnosis not present

## 2019-10-01 DIAGNOSIS — Z955 Presence of coronary angioplasty implant and graft: Secondary | ICD-10-CM | POA: Diagnosis not present

## 2019-10-01 DIAGNOSIS — M1612 Unilateral primary osteoarthritis, left hip: Secondary | ICD-10-CM | POA: Diagnosis not present

## 2019-10-01 DIAGNOSIS — K219 Gastro-esophageal reflux disease without esophagitis: Secondary | ICD-10-CM | POA: Diagnosis not present

## 2019-10-01 DIAGNOSIS — I2510 Atherosclerotic heart disease of native coronary artery without angina pectoris: Secondary | ICD-10-CM | POA: Diagnosis not present

## 2019-10-01 DIAGNOSIS — I10 Essential (primary) hypertension: Secondary | ICD-10-CM | POA: Diagnosis not present

## 2019-10-01 DIAGNOSIS — R42 Dizziness and giddiness: Secondary | ICD-10-CM | POA: Diagnosis not present

## 2019-11-22 DIAGNOSIS — I700 Atherosclerosis of aorta: Secondary | ICD-10-CM | POA: Diagnosis not present

## 2019-11-22 DIAGNOSIS — I350 Nonrheumatic aortic (valve) stenosis: Secondary | ICD-10-CM | POA: Diagnosis not present

## 2019-11-22 DIAGNOSIS — Z955 Presence of coronary angioplasty implant and graft: Secondary | ICD-10-CM | POA: Diagnosis not present

## 2019-11-22 DIAGNOSIS — E785 Hyperlipidemia, unspecified: Secondary | ICD-10-CM | POA: Diagnosis not present

## 2019-11-22 DIAGNOSIS — I10 Essential (primary) hypertension: Secondary | ICD-10-CM | POA: Diagnosis not present

## 2019-11-22 DIAGNOSIS — I2510 Atherosclerotic heart disease of native coronary artery without angina pectoris: Secondary | ICD-10-CM | POA: Diagnosis not present

## 2019-11-22 DIAGNOSIS — R42 Dizziness and giddiness: Secondary | ICD-10-CM | POA: Diagnosis not present

## 2019-12-02 DIAGNOSIS — I350 Nonrheumatic aortic (valve) stenosis: Secondary | ICD-10-CM | POA: Diagnosis not present

## 2019-12-02 DIAGNOSIS — I2510 Atherosclerotic heart disease of native coronary artery without angina pectoris: Secondary | ICD-10-CM | POA: Diagnosis not present

## 2019-12-04 DIAGNOSIS — K219 Gastro-esophageal reflux disease without esophagitis: Secondary | ICD-10-CM | POA: Diagnosis not present

## 2019-12-04 DIAGNOSIS — K449 Diaphragmatic hernia without obstruction or gangrene: Secondary | ICD-10-CM | POA: Diagnosis not present

## 2019-12-04 DIAGNOSIS — K224 Dyskinesia of esophagus: Secondary | ICD-10-CM | POA: Diagnosis not present

## 2019-12-04 DIAGNOSIS — R1310 Dysphagia, unspecified: Secondary | ICD-10-CM | POA: Diagnosis not present

## 2019-12-18 DIAGNOSIS — H353211 Exudative age-related macular degeneration, right eye, with active choroidal neovascularization: Secondary | ICD-10-CM | POA: Diagnosis not present

## 2020-01-16 DIAGNOSIS — H353211 Exudative age-related macular degeneration, right eye, with active choroidal neovascularization: Secondary | ICD-10-CM | POA: Diagnosis not present

## 2020-02-20 DIAGNOSIS — H353211 Exudative age-related macular degeneration, right eye, with active choroidal neovascularization: Secondary | ICD-10-CM | POA: Diagnosis not present

## 2020-04-02 DIAGNOSIS — K219 Gastro-esophageal reflux disease without esophagitis: Secondary | ICD-10-CM | POA: Diagnosis not present

## 2020-04-02 DIAGNOSIS — M1612 Unilateral primary osteoarthritis, left hip: Secondary | ICD-10-CM | POA: Diagnosis not present

## 2020-04-02 DIAGNOSIS — R42 Dizziness and giddiness: Secondary | ICD-10-CM | POA: Diagnosis not present

## 2020-04-02 DIAGNOSIS — I10 Essential (primary) hypertension: Secondary | ICD-10-CM | POA: Diagnosis not present

## 2020-04-02 DIAGNOSIS — I350 Nonrheumatic aortic (valve) stenosis: Secondary | ICD-10-CM | POA: Diagnosis not present

## 2020-04-02 DIAGNOSIS — Z955 Presence of coronary angioplasty implant and graft: Secondary | ICD-10-CM | POA: Diagnosis not present

## 2020-04-02 DIAGNOSIS — Z9189 Other specified personal risk factors, not elsewhere classified: Secondary | ICD-10-CM | POA: Diagnosis not present

## 2020-04-02 DIAGNOSIS — Z0000 Encounter for general adult medical examination without abnormal findings: Secondary | ICD-10-CM | POA: Diagnosis not present

## 2020-04-02 DIAGNOSIS — I2510 Atherosclerotic heart disease of native coronary artery without angina pectoris: Secondary | ICD-10-CM | POA: Diagnosis not present

## 2020-05-06 DIAGNOSIS — H353211 Exudative age-related macular degeneration, right eye, with active choroidal neovascularization: Secondary | ICD-10-CM | POA: Diagnosis not present

## 2020-05-07 DIAGNOSIS — Z0000 Encounter for general adult medical examination without abnormal findings: Secondary | ICD-10-CM | POA: Diagnosis not present

## 2020-05-07 DIAGNOSIS — I2510 Atherosclerotic heart disease of native coronary artery without angina pectoris: Secondary | ICD-10-CM | POA: Diagnosis not present

## 2020-05-07 DIAGNOSIS — G8929 Other chronic pain: Secondary | ICD-10-CM | POA: Diagnosis not present

## 2020-05-07 DIAGNOSIS — M25552 Pain in left hip: Secondary | ICD-10-CM | POA: Diagnosis not present

## 2020-05-07 DIAGNOSIS — M1612 Unilateral primary osteoarthritis, left hip: Secondary | ICD-10-CM | POA: Diagnosis not present

## 2020-05-07 DIAGNOSIS — I350 Nonrheumatic aortic (valve) stenosis: Secondary | ICD-10-CM | POA: Diagnosis not present

## 2020-05-07 DIAGNOSIS — I10 Essential (primary) hypertension: Secondary | ICD-10-CM | POA: Diagnosis not present

## 2020-05-07 DIAGNOSIS — M47817 Spondylosis without myelopathy or radiculopathy, lumbosacral region: Secondary | ICD-10-CM | POA: Diagnosis not present

## 2020-06-04 DIAGNOSIS — I10 Essential (primary) hypertension: Secondary | ICD-10-CM | POA: Diagnosis not present

## 2020-06-04 DIAGNOSIS — Z955 Presence of coronary angioplasty implant and graft: Secondary | ICD-10-CM | POA: Diagnosis not present

## 2020-06-04 DIAGNOSIS — I2510 Atherosclerotic heart disease of native coronary artery without angina pectoris: Secondary | ICD-10-CM | POA: Diagnosis not present

## 2020-06-04 DIAGNOSIS — I350 Nonrheumatic aortic (valve) stenosis: Secondary | ICD-10-CM | POA: Diagnosis not present

## 2020-06-04 DIAGNOSIS — Z23 Encounter for immunization: Secondary | ICD-10-CM | POA: Diagnosis not present

## 2020-06-04 DIAGNOSIS — E785 Hyperlipidemia, unspecified: Secondary | ICD-10-CM | POA: Diagnosis not present

## 2020-07-21 DIAGNOSIS — R399 Unspecified symptoms and signs involving the genitourinary system: Secondary | ICD-10-CM | POA: Diagnosis not present

## 2020-07-21 DIAGNOSIS — R8290 Unspecified abnormal findings in urine: Secondary | ICD-10-CM | POA: Diagnosis not present

## 2020-08-12 DIAGNOSIS — H353211 Exudative age-related macular degeneration, right eye, with active choroidal neovascularization: Secondary | ICD-10-CM | POA: Diagnosis not present

## 2020-09-10 ENCOUNTER — Emergency Department: Payer: PPO

## 2020-09-10 ENCOUNTER — Other Ambulatory Visit: Payer: Self-pay

## 2020-09-10 ENCOUNTER — Emergency Department
Admission: EM | Admit: 2020-09-10 | Discharge: 2020-09-10 | Disposition: A | Discharge status: Home / Self Care | Payer: PPO | Attending: Emergency Medicine | Admitting: Emergency Medicine

## 2020-09-10 ENCOUNTER — Encounter: Payer: Self-pay | Admitting: Emergency Medicine

## 2020-09-10 DIAGNOSIS — R52 Pain, unspecified: Secondary | ICD-10-CM | POA: Diagnosis not present

## 2020-09-10 DIAGNOSIS — S0990XA Unspecified injury of head, initial encounter: Secondary | ICD-10-CM | POA: Diagnosis not present

## 2020-09-10 DIAGNOSIS — I10 Essential (primary) hypertension: Secondary | ICD-10-CM | POA: Diagnosis not present

## 2020-09-10 DIAGNOSIS — Z96641 Presence of right artificial hip joint: Secondary | ICD-10-CM | POA: Insufficient documentation

## 2020-09-10 DIAGNOSIS — R410 Disorientation, unspecified: Secondary | ICD-10-CM | POA: Insufficient documentation

## 2020-09-10 DIAGNOSIS — S161XXA Strain of muscle, fascia and tendon at neck level, initial encounter: Secondary | ICD-10-CM

## 2020-09-10 DIAGNOSIS — M542 Cervicalgia: Secondary | ICD-10-CM | POA: Insufficient documentation

## 2020-09-10 DIAGNOSIS — Z041 Encounter for examination and observation following transport accident: Secondary | ICD-10-CM | POA: Diagnosis not present

## 2020-09-10 DIAGNOSIS — Y92410 Unspecified street and highway as the place of occurrence of the external cause: Secondary | ICD-10-CM | POA: Diagnosis not present

## 2020-09-10 DIAGNOSIS — Z87891 Personal history of nicotine dependence: Secondary | ICD-10-CM | POA: Diagnosis not present

## 2020-09-10 DIAGNOSIS — I2510 Atherosclerotic heart disease of native coronary artery without angina pectoris: Secondary | ICD-10-CM | POA: Diagnosis not present
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
## 2020-11-10 DIAGNOSIS — I10 Essential (primary) hypertension: Secondary | ICD-10-CM | POA: Diagnosis not present

## 2020-11-10 DIAGNOSIS — G8929 Other chronic pain: Secondary | ICD-10-CM | POA: Diagnosis not present

## 2020-11-10 DIAGNOSIS — I2510 Atherosclerotic heart disease of native coronary artery without angina pectoris: Secondary | ICD-10-CM | POA: Diagnosis not present

## 2020-11-10 DIAGNOSIS — M25552 Pain in left hip: Secondary | ICD-10-CM | POA: Diagnosis not present

## 2020-11-10 DIAGNOSIS — M1612 Unilateral primary osteoarthritis, left hip: Secondary | ICD-10-CM | POA: Diagnosis not present

## 2020-11-10 DIAGNOSIS — I350 Nonrheumatic aortic (valve) stenosis: Secondary | ICD-10-CM | POA: Diagnosis not present

## 2020-11-10 DIAGNOSIS — M47817 Spondylosis without myelopathy or radiculopathy, lumbosacral region: Secondary | ICD-10-CM | POA: Diagnosis not present

## 2020-11-11 DIAGNOSIS — R8290 Unspecified abnormal findings in urine: Secondary | ICD-10-CM | POA: Diagnosis not present

## 2020-11-17 DIAGNOSIS — Z955 Presence of coronary angioplasty implant and graft: Secondary | ICD-10-CM | POA: Diagnosis not present

## 2020-11-17 DIAGNOSIS — Z23 Encounter for immunization: Secondary | ICD-10-CM | POA: Diagnosis not present

## 2020-11-17 DIAGNOSIS — I2510 Atherosclerotic heart disease of native coronary artery without angina pectoris: Secondary | ICD-10-CM | POA: Diagnosis not present

## 2020-11-17 DIAGNOSIS — I350 Nonrheumatic aortic (valve) stenosis: Secondary | ICD-10-CM | POA: Diagnosis not present

## 2020-11-17 DIAGNOSIS — M1612 Unilateral primary osteoarthritis, left hip: Secondary | ICD-10-CM | POA: Diagnosis not present

## 2020-11-17 DIAGNOSIS — I700 Atherosclerosis of aorta: Secondary | ICD-10-CM | POA: Diagnosis not present

## 2020-11-17 DIAGNOSIS — M542 Cervicalgia: Secondary | ICD-10-CM | POA: Diagnosis not present

## 2020-11-17 DIAGNOSIS — Z0000 Encounter for general adult medical examination without abnormal findings: Secondary | ICD-10-CM | POA: Diagnosis not present

## 2020-11-18 DIAGNOSIS — H353211 Exudative age-related macular degeneration, right eye, with active choroidal neovascularization: Secondary | ICD-10-CM | POA: Diagnosis not present

## 2020-12-02 DIAGNOSIS — Z955 Presence of coronary angioplasty implant and graft: Secondary | ICD-10-CM | POA: Diagnosis not present

## 2020-12-02 DIAGNOSIS — I10 Essential (primary) hypertension: Secondary | ICD-10-CM | POA: Diagnosis not present

## 2020-12-02 DIAGNOSIS — E785 Hyperlipidemia, unspecified: Secondary | ICD-10-CM | POA: Diagnosis not present

## 2020-12-02 DIAGNOSIS — I2510 Atherosclerotic heart disease of native coronary artery without angina pectoris: Secondary | ICD-10-CM | POA: Diagnosis not present

## 2020-12-02 DIAGNOSIS — Z23 Encounter for immunization: Secondary | ICD-10-CM | POA: Diagnosis not present

## 2020-12-02 DIAGNOSIS — I350 Nonrheumatic aortic (valve) stenosis: Secondary | ICD-10-CM | POA: Diagnosis not present

## 2020-12-02 DIAGNOSIS — R42 Dizziness and giddiness: Secondary | ICD-10-CM | POA: Diagnosis not present

## 2021-02-12 ENCOUNTER — Other Ambulatory Visit: Payer: Self-pay

## 2021-02-12 ENCOUNTER — Emergency Department: Payer: PPO

## 2021-02-12 DIAGNOSIS — I10 Essential (primary) hypertension: Secondary | ICD-10-CM | POA: Diagnosis not present

## 2021-02-12 DIAGNOSIS — Z5321 Procedure and treatment not carried out due to patient leaving prior to being seen by health care provider: Secondary | ICD-10-CM | POA: Diagnosis not present

## 2021-02-12 DIAGNOSIS — R0602 Shortness of breath: Secondary | ICD-10-CM | POA: Insufficient documentation

## 2021-02-12 DIAGNOSIS — K449 Diaphragmatic hernia without obstruction or gangrene: Secondary | ICD-10-CM | POA: Diagnosis not present

## 2021-02-12 DIAGNOSIS — R079 Chest pain, unspecified: Secondary | ICD-10-CM | POA: Insufficient documentation

## 2021-02-12 DIAGNOSIS — R52 Pain, unspecified: Secondary | ICD-10-CM | POA: Diagnosis not present

## 2021-02-12 LAB — BASIC METABOLIC PANEL
Anion gap: 6 (ref 5–15)
BUN: 18 mg/dL (ref 8–23)
CO2: 24 mmol/L (ref 22–32)
Calcium: 9 mg/dL (ref 8.9–10.3)
Chloride: 107 mmol/L (ref 98–111)
Creatinine, Ser: 0.49 mg/dL (ref 0.44–1.00)
GFR, Estimated: >60 mL/min (ref >60)
Glucose, Bld: 105 mg/dL — ABNORMAL HIGH (ref 70–99)
Potassium: 3.4 mmol/L — ABNORMAL LOW (ref 3.5–5.1)
Sodium: 137 mmol/L (ref 135–145)

## 2021-02-12 LAB — CBC
HCT: 39.2 % (ref 36.0–46.0)
Hemoglobin: 12.8 g/dL (ref 12.0–15.0)
MCH: 32.7 pg (ref 26.0–34.0)
MCHC: 32.7 g/dL (ref 30.0–36.0)
MCV: 100.3 fL — ABNORMAL HIGH (ref 80.0–100.0)
Platelets: 225 10*3/uL (ref 150–400)
RBC: 3.91 MIL/uL (ref 3.87–5.11)
RDW: 14 % (ref 11.5–15.5)
WBC: 3.6 10*3/uL — ABNORMAL LOW (ref 4.0–10.5)
nRBC: 0 % (ref 0.0–0.2)

## 2021-02-12 LAB — TROPONIN I (HIGH SENSITIVITY): Troponin I (High Sensitivity): 10 ng/L (ref <18)

## 2021-02-12 NOTE — ED Triage Notes (Signed)
Pt c/o right sided chest pain that radiates down to her right arm. The describes the pain as a tight sensation in her chest and has some mild SOB during episode.

## 2021-02-13 ENCOUNTER — Emergency Department
Admission: EM | Admit: 2021-02-13 | Discharge: 2021-02-13 | Discharge status: Against Medical Advice | Payer: PPO | Attending: Emergency Medicine | Admitting: Emergency Medicine

## 2021-02-16 ENCOUNTER — Other Ambulatory Visit
Admission: RE | Admit: 2021-02-16 | Discharge: 2021-02-16 | Disposition: A | Discharge status: Home / Self Care | Payer: PPO | Source: Ambulatory Visit | Attending: Physician Assistant | Admitting: Physician Assistant

## 2021-02-16 DIAGNOSIS — R0609 Other forms of dyspnea: Secondary | ICD-10-CM | POA: Diagnosis not present

## 2021-02-16 DIAGNOSIS — I252 Old myocardial infarction: Secondary | ICD-10-CM | POA: Diagnosis not present

## 2021-02-16 DIAGNOSIS — Z23 Encounter for immunization: Secondary | ICD-10-CM | POA: Diagnosis not present

## 2021-02-16 DIAGNOSIS — I10 Essential (primary) hypertension: Secondary | ICD-10-CM | POA: Diagnosis not present

## 2021-02-16 DIAGNOSIS — R079 Chest pain, unspecified: Secondary | ICD-10-CM | POA: Diagnosis not present

## 2021-02-16 DIAGNOSIS — I700 Atherosclerosis of aorta: Secondary | ICD-10-CM | POA: Diagnosis not present

## 2021-02-16 DIAGNOSIS — E785 Hyperlipidemia, unspecified: Secondary | ICD-10-CM | POA: Diagnosis not present

## 2021-02-16 LAB — TROPONIN I (HIGH SENSITIVITY): Troponin I (High Sensitivity): 10 ng/L (ref <18)

## 2021-03-05 DIAGNOSIS — R0609 Other forms of dyspnea: Secondary | ICD-10-CM | POA: Diagnosis not present

## 2021-03-05 DIAGNOSIS — R079 Chest pain, unspecified: Secondary | ICD-10-CM | POA: Diagnosis not present

## 2021-03-05 DIAGNOSIS — I252 Old myocardial infarction: Secondary | ICD-10-CM | POA: Diagnosis not present

## 2021-03-10 DIAGNOSIS — H353211 Exudative age-related macular degeneration, right eye, with active choroidal neovascularization: Secondary | ICD-10-CM | POA: Diagnosis not present

## 2021-04-19 ENCOUNTER — Other Ambulatory Visit: Payer: Self-pay

## 2021-04-19 ENCOUNTER — Ambulatory Visit (INDEPENDENT_AMBULATORY_CARE_PROVIDER_SITE_OTHER): Payer: PPO | Admitting: Dermatology

## 2021-04-19 DIAGNOSIS — Z1283 Encounter for screening for malignant neoplasm of skin: Secondary | ICD-10-CM | POA: Diagnosis not present

## 2021-04-19 DIAGNOSIS — L570 Actinic keratosis: Secondary | ICD-10-CM | POA: Diagnosis not present

## 2021-04-19 DIAGNOSIS — L820 Inflamed seborrheic keratosis: Secondary | ICD-10-CM | POA: Diagnosis not present

## 2021-04-19 DIAGNOSIS — L720 Epidermal cyst: Secondary | ICD-10-CM | POA: Diagnosis not present

## 2021-04-19 DIAGNOSIS — L821 Other seborrheic keratosis: Secondary | ICD-10-CM | POA: Diagnosis not present

## 2021-04-19 DIAGNOSIS — L578 Other skin changes due to chronic exposure to nonionizing radiation: Secondary | ICD-10-CM

## 2021-04-19 DIAGNOSIS — H61002 Unspecified perichondritis of left external ear: Secondary | ICD-10-CM

## 2021-04-19 NOTE — Progress Notes (Signed)
New Patient Visit  Subjective  Amber Sanford is a 86 y.o. female who presents for the following: Skin Problem The patient presents for Upper Body Skin Exam (UBSE) for skin cancer screening and mole check.  The patient has spots, moles and lesions to be evaluated, some may be new or changing and the patient has concerns that these could be cancer.  The following portions of the chart were reviewed this encounter and updated as appropriate:   Tobacco   Allergies   Meds   Problems   Med Hx   Surg Hx   Fam Hx      Review of Systems:  No other skin or systemic complaints except as noted in HPI or Assessment and Plan.  Objective  Well appearing patient in no apparent distress; mood and affect are within normal limits.  All skin waist up examined.  right anterior lat base of neck 0.4 cm Subcutaneous nodule.   left sup helix x 1, forehead x 1  (2) (2) Erythematous thin papules/macules with gritty scale.   left superior helix Firm papule    Assessment & Plan  Epidermal inclusion cyst right anterior lat base of neck  Benign-appearing. Exam most consistent with an epidermal inclusion cyst. Discussed that a cyst is a benign growth that can grow over time and sometimes get irritated or inflamed. Recommend observation if it is not bothersome. Discussed option of surgical excision to remove it if it is growing, symptomatic, or other changes noted. Please call for new or changing lesions so they can be evaluated.    Inflamed seborrheic keratosis (4) right lat brow x 1, right inferior scapula x 1, left side x 1, left dorsum hand x 1 (4)  Reassured benign age-related growth.  Recommend observation.  Discussed cryotherapy if spot(s) become irritated or inflamed.   Prior to procedure, discussed risks of blister formation, small wound, skin dyspigmentation, or rare scar following cryotherapy. Recommend Vaseline ointment to treated areas while healing.   Destruction of lesion - right lat brow x  1, right inferior scapula x 1, left side x 1, left dorsum hand x 1 (4) Complexity: simple   Destruction method: cryotherapy   Informed consent: discussed and consent obtained   Timeout:  patient name, date of birth, surgical site, and procedure verified Lesion destroyed using liquid nitrogen: Yes   Region frozen until ice ball extended beyond lesion: Yes   Outcome: patient tolerated procedure well with no complications   Post-procedure details: wound care instructions given    AK (actinic keratosis) (2) left sup helix x 1, forehead x 1  (2)  Actinic keratoses are precancerous spots that appear secondary to cumulative UV radiation exposure/sun exposure over time. They are chronic with expected duration over 1 year. A portion of actinic keratoses will progress to squamous cell carcinoma of the skin. It is not possible to reliably predict which spots will progress to skin cancer and so treatment is recommended to prevent development of skin cancer.  Recommend daily broad spectrum sunscreen SPF 30+ to sun-exposed areas, reapply every 2 hours as needed.  Recommend staying in the shade or wearing long sleeves, sun glasses (UVA+UVB protection) and wide brim hats (4-inch brim around the entire circumference of the hat). Call for new or changing lesions.   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  Trauma or pressure from sleeping on the ear or from cell phone use and sun damage may be exacerbating factors.  Treatment may include using a C-shaped airplane neck pillow for sleeping on the side of the head so no pressure is on the ear.  Other treatments include topical or intralesional steroids; liquid nitrogen or laser destruction; shave removal or excision.  The condition can be difficult to treat and persist or recur despite treatment.   Skin cancer screening  Seborrheic Keratoses - Stuck-on, waxy, tan-brown papules and/or plaques  - Benign-appearing - Discussed benign etiology and prognosis. - Observe - Call for any changes  Actinic Damage - chronic, secondary to cumulative UV radiation exposure/sun exposure over time - diffuse scaly erythematous macules with underlying dyspigmentation - Recommend daily broad spectrum sunscreen SPF 30+ to sun-exposed areas, reapply every 2 hours as needed.  - Recommend staying in the shade or wearing long sleeves, sun glasses (UVA+UVB protection) and wide brim hats (4-inch brim around the entire circumference of the hat). - Call for new or changing lesions.   Return in about 6 months (around 10/17/2021) for [place], [place], [place], Marye Round, CMA, am acting as scribe for Sarina Ser, MD .  Documentation: I have reviewed the above documentation for accuracy and completeness, and I agree with the above.  Sarina Ser, MD

## 2021-04-19 NOTE — Patient Instructions (Addendum)

## 2021-04-20 ENCOUNTER — Encounter: Payer: Self-pay | Admitting: Dermatology

## 2021-06-01 DIAGNOSIS — E785 Hyperlipidemia, unspecified: Secondary | ICD-10-CM | POA: Diagnosis not present

## 2021-06-01 DIAGNOSIS — I10 Essential (primary) hypertension: Secondary | ICD-10-CM | POA: Diagnosis not present

## 2021-06-01 DIAGNOSIS — I350 Nonrheumatic aortic (valve) stenosis: Secondary | ICD-10-CM | POA: Diagnosis not present

## 2021-06-01 DIAGNOSIS — Z955 Presence of coronary angioplasty implant and graft: Secondary | ICD-10-CM | POA: Diagnosis not present

## 2021-06-01 DIAGNOSIS — I2510 Atherosclerotic heart disease of native coronary artery without angina pectoris: Secondary | ICD-10-CM | POA: Diagnosis not present

## 2021-06-02 DIAGNOSIS — M1612 Unilateral primary osteoarthritis, left hip: Secondary | ICD-10-CM | POA: Diagnosis not present

## 2021-06-02 DIAGNOSIS — E7800 Pure hypercholesterolemia, unspecified: Secondary | ICD-10-CM | POA: Diagnosis not present

## 2021-06-02 DIAGNOSIS — Z0000 Encounter for general adult medical examination without abnormal findings: Secondary | ICD-10-CM | POA: Diagnosis not present

## 2021-06-02 DIAGNOSIS — H918X3 Other specified hearing loss, bilateral: Secondary | ICD-10-CM | POA: Diagnosis not present

## 2021-06-02 DIAGNOSIS — I2510 Atherosclerotic heart disease of native coronary artery without angina pectoris: Secondary | ICD-10-CM | POA: Diagnosis not present

## 2021-06-02 DIAGNOSIS — H353211 Exudative age-related macular degeneration, right eye, with active choroidal neovascularization: Secondary | ICD-10-CM | POA: Diagnosis not present

## 2021-06-02 DIAGNOSIS — I700 Atherosclerosis of aorta: Secondary | ICD-10-CM | POA: Diagnosis not present

## 2021-06-02 DIAGNOSIS — H26491 Other secondary cataract, right eye: Secondary | ICD-10-CM | POA: Diagnosis not present

## 2021-06-02 DIAGNOSIS — I10 Essential (primary) hypertension: Secondary | ICD-10-CM | POA: Diagnosis not present

## 2021-06-02 DIAGNOSIS — N343 Urethral syndrome, unspecified: Secondary | ICD-10-CM | POA: Diagnosis not present

## 2021-06-10 DIAGNOSIS — M25512 Pain in left shoulder: Secondary | ICD-10-CM | POA: Diagnosis not present

## 2021-06-10 DIAGNOSIS — M13812 Other specified arthritis, left shoulder: Secondary | ICD-10-CM | POA: Diagnosis not present

## 2021-08-19 ENCOUNTER — Emergency Department
Admission: EM | Admit: 2021-08-19 | Discharge: 2021-08-19 | Disposition: A | Discharge status: Home / Self Care | Payer: PPO | Attending: Emergency Medicine | Admitting: Emergency Medicine

## 2021-08-19 ENCOUNTER — Emergency Department: Payer: PPO

## 2021-08-19 ENCOUNTER — Other Ambulatory Visit: Payer: Self-pay

## 2021-08-19 DIAGNOSIS — K8020 Calculus of gallbladder without cholecystitis without obstruction: Secondary | ICD-10-CM | POA: Diagnosis not present

## 2021-08-19 DIAGNOSIS — R197 Diarrhea, unspecified: Secondary | ICD-10-CM | POA: Diagnosis not present

## 2021-08-19 DIAGNOSIS — M1612 Unilateral primary osteoarthritis, left hip: Secondary | ICD-10-CM | POA: Diagnosis not present

## 2021-08-19 DIAGNOSIS — N3090 Cystitis, unspecified without hematuria: Secondary | ICD-10-CM | POA: Diagnosis not present

## 2021-08-19 DIAGNOSIS — R3915 Urgency of urination: Secondary | ICD-10-CM | POA: Diagnosis present

## 2021-08-19 DIAGNOSIS — R634 Abnormal weight loss: Secondary | ICD-10-CM | POA: Diagnosis not present

## 2021-08-19 DIAGNOSIS — I700 Atherosclerosis of aorta: Secondary | ICD-10-CM | POA: Diagnosis not present

## 2021-08-19 DIAGNOSIS — K5730 Diverticulosis of large intestine without perforation or abscess without bleeding: Secondary | ICD-10-CM | POA: Diagnosis not present

## 2021-08-19 DIAGNOSIS — R531 Weakness: Secondary | ICD-10-CM | POA: Diagnosis not present

## 2021-08-19 DIAGNOSIS — I2510 Atherosclerotic heart disease of native coronary artery without angina pectoris: Secondary | ICD-10-CM | POA: Diagnosis not present

## 2021-08-19 DIAGNOSIS — N281 Cyst of kidney, acquired: Secondary | ICD-10-CM | POA: Diagnosis not present

## 2021-08-19 DIAGNOSIS — K644 Residual hemorrhoidal skin tags: Secondary | ICD-10-CM | POA: Diagnosis not present

## 2021-08-19 DIAGNOSIS — Z955 Presence of coronary angioplasty implant and graft: Secondary | ICD-10-CM | POA: Diagnosis not present

## 2021-08-19 DIAGNOSIS — E46 Unspecified protein-calorie malnutrition: Secondary | ICD-10-CM | POA: Diagnosis not present

## 2021-08-19 LAB — URINALYSIS, ROUTINE W REFLEX MICROSCOPIC
Bilirubin Urine: NEGATIVE
Glucose, UA: NEGATIVE mg/dL
Ketones, ur: NEGATIVE mg/dL
Nitrite: POSITIVE — AB
Protein, ur: NEGATIVE mg/dL
Specific Gravity, Urine: 1.009 (ref 1.005–1.030)
pH: 5 (ref 5.0–8.0)

## 2021-08-19 LAB — CBC WITH DIFFERENTIAL/PLATELET
Abs Immature Granulocytes: 0.01 10*3/uL (ref 0.00–0.07)
Basophils Absolute: 0 10*3/uL (ref 0.0–0.1)
Basophils Relative: 1 %
Eosinophils Absolute: 0.1 10*3/uL (ref 0.0–0.5)
Eosinophils Relative: 1 %
HCT: 34.9 % — ABNORMAL LOW (ref 36.0–46.0)
Hemoglobin: 11.3 g/dL — ABNORMAL LOW (ref 12.0–15.0)
Immature Granulocytes: 0 %
Lymphocytes Relative: 28 %
Lymphs Abs: 1.2 10*3/uL (ref 0.7–4.0)
MCH: 32 pg (ref 26.0–34.0)
MCHC: 32.4 g/dL (ref 30.0–36.0)
MCV: 98.9 fL (ref 80.0–100.0)
Monocytes Absolute: 0.3 10*3/uL (ref 0.1–1.0)
Monocytes Relative: 7 %
Neutro Abs: 2.6 10*3/uL (ref 1.7–7.7)
Neutrophils Relative %: 63 %
Platelets: 196 10*3/uL (ref 150–400)
RBC: 3.53 MIL/uL — ABNORMAL LOW (ref 3.87–5.11)
RDW: 14.3 % (ref 11.5–15.5)
WBC: 4.2 10*3/uL (ref 4.0–10.5)
nRBC: 0 % (ref 0.0–0.2)

## 2021-08-19 LAB — COMPREHENSIVE METABOLIC PANEL
ALT: 12 U/L (ref 0–44)
AST: 19 U/L (ref 15–41)
Albumin: 4.1 g/dL (ref 3.5–5.0)
Alkaline Phosphatase: 52 U/L (ref 38–126)
Anion gap: 7 (ref 5–15)
BUN: 15 mg/dL (ref 8–23)
CO2: 23 mmol/L (ref 22–32)
Calcium: 9 mg/dL (ref 8.9–10.3)
Chloride: 109 mmol/L (ref 98–111)
Creatinine, Ser: 0.53 mg/dL (ref 0.44–1.00)
GFR, Estimated: >60 mL/min (ref >60)
Glucose, Bld: 103 mg/dL — ABNORMAL HIGH (ref 70–99)
Potassium: 4 mmol/L (ref 3.5–5.1)
Sodium: 139 mmol/L (ref 135–145)
Total Bilirubin: 1 mg/dL (ref 0.3–1.2)
Total Protein: 7.3 g/dL (ref 6.5–8.1)

## 2021-08-19 LAB — LIPASE, BLOOD: Lipase: 24 U/L (ref 11–51)

## 2021-08-19 MED ORDER — CEPHALEXIN 500 MG PO CAPS: 500.0000 mg | ORAL_CAPSULE | Freq: Two times a day (BID) | ORAL | 0 refills | Status: AC

## 2021-08-19 MED ORDER — IOHEXOL 300 MG/ML  SOLN
100.0000 mL | Freq: Once | INTRAMUSCULAR | Status: AC | PRN
  Administered 2021-08-19: 100 mL via INTRAVENOUS

## 2021-08-19 MED ORDER — SODIUM CHLORIDE 0.9 % IV SOLN
1.0000 g | Freq: Once | INTRAVENOUS | Status: AC
  Administered 2021-08-19: 1 g via INTRAVENOUS
  Filled 2021-08-19: qty 10

## 2021-08-19 NOTE — ED Provider Notes (Signed)
[facility] Provider Note    Event Date/Time   First MD Initiated Contact with Patient 08/19/21 1923     (approximate)   History   Chief Complaint: Diarrhea and GI Bleeding   HPI  Amber Sanford is a 86 y.o. female with a past history of GERD, diverticulosis who comes the ED complaining of some blood in bowel movements over the last couple of days.  Also complains of some urinary urgency and suprapubic pressure.  No fevers or chills, no dizziness or syncope, no vomiting.     Physical Exam   Triage Vital Signs: ED Triage Vitals  Enc Vitals Group     BP 08/19/21 1608 (!) 131/57     Pulse Rate 08/19/21 1608 72     Resp 08/19/21 1608 16     Temp 08/19/21 1608 98.3 F (36.8 C)     Temp Source 08/19/21 1608 Oral     SpO2 08/19/21 1608 100 %     Weight 08/19/21 1617 109 lb (49.4 kg)     Height 08/19/21 1617 '5\' 3"'$  (1.6 m)     Head Circumference --      Peak Flow --      Pain Score 08/19/21 1617 0     Pain Loc --      Pain Edu? --      Excl. in [place]? --     Most recent vital signs: Vitals:   08/19/21 1608  BP: (!) 131/57  Pulse: 72  Resp: 16  Temp: 98.3 F (36.8 C)  SpO2: 100%    General: Awake, no distress.  CV:  Good peripheral perfusion.  Regular rate rhythm Resp:  Normal effort.  Abd:  No distention.  Soft with left lower quadrant and suprapubic tenderness.  Rectal exam shows external hemorrhoids which are nonthrombosed inflamed or hemorrhaging.  Scant red blood on exam, Hemoccult positive Other:  No lower extremity edema, no pallor.  Moist oral mucosa.   ED Results / Procedures / Treatments   Labs (all labs ordered are listed, but only abnormal results are displayed) Labs Reviewed  CBC WITH DIFFERENTIAL/PLATELET - Abnormal; Notable for the following components:      Result Value   RBC 3.53 (*)    Hemoglobin 11.3 (*)    HCT 34.9 (*)    All other components within normal limits  COMPREHENSIVE METABOLIC PANEL - Abnormal; Notable  for the following components:   Glucose, Bld 103 (*)    All other components within normal limits  URINALYSIS, ROUTINE W REFLEX MICROSCOPIC - Abnormal; Notable for the following components:   Color, Urine YELLOW (*)    APPearance HAZY (*)    Hgb urine dipstick SMALL (*)    Nitrite POSITIVE (*)    Leukocytes,Ua MODERATE (*)    Bacteria, UA FEW (*)    All other components within normal limits  URINE CULTURE  LIPASE, BLOOD     EKG    RADIOLOGY CT abdomen pelvis viewed and interpreted by me, no free air or intra-abdominal abscess.  Radiology report reviewed   PROCEDURES:  Procedures   MEDICATIONS ORDERED IN ED: Medications  cefTRIAXone (ROCEPHIN) 1 g in sodium chloride 0.9 % 100 mL IVPB (0 g Intravenous Stopped 08/19/21 2058)  iohexol (OMNIPAQUE) 300 MG/ML solution 100 mL (100 mLs Intravenous Contrast Given 08/19/21 2057)     IMPRESSION / MDM / ASSESSMENT AND PLAN / ED COURSE  I reviewed the triage vital signs and the nursing notes.  Differential diagnosis includes, but is not limited to, cystitis, diverticulitis, bleeding hemorrhoids, anemia  Patient's presentation is most consistent with acute presentation with potential threat to life or bodily function.  Patient presents with report of rectal bleeding as well as symptoms of UTI.  Serum labs are unremarkable, vital signs are normal.  Urinalysis is consistent with a UTI.  Because of her left lower quadrant pain, obtain CT scan to evaluate for diverticulitis and intra-abdominal complications.  This was unremarkable.  Patient given IV Rocephin in the ED and will be prescribed Keflex to finish treatment of UTI after which she can follow-up with PCP for symptom reassessment and test of cure as needed.       FINAL CLINICAL IMPRESSION(S) / ED DIAGNOSES   Final diagnoses:  Cystitis     Rx / DC Orders   ED Discharge Orders          Ordered    cephALEXin (KEFLEX) 500 MG capsule  2 times  daily        08/19/21 2134             Note:  This document was prepared using Dragon voice recognition software and may include unintentional dictation errors.   Carrie Mew, MD 08/19/21 2139

## 2021-08-19 NOTE — ED Triage Notes (Signed)
Pt reports that she has been having diarrhea for the past few months, pt started having bleeding with her stools yesterday and today.

## 2021-08-19 NOTE — ED Provider Triage Note (Signed)
Emergency Medicine Provider Triage Evaluation Note  Amber Sanford , a 86 y.o. female  was evaluated in triage.  Pt complains of several months of diarrhea, noticed some blood in the diarrhea today.  Diarrhea has been worse over the last couple of days, no abdominal pain, fevers, nausea or vomiting  Review of Systems  Positive: Diarrhea, Negative: Chest pain, shortness of breath, abdominal pain, fever  Physical Exam  BP (!) 131/57 (BP Location: Left Arm)   Pulse 72   Temp 98.3 F (36.8 C) (Oral)   Resp 16   Ht '5\' 3"'$  (1.6 m)   Wt 49.4 kg   SpO2 100%   BMI 19.31 kg/m  Gen:   Awake, no distress, presents in wheelchair Resp:  Normal effort  MSK:   Moves extremities without difficulty  Other:    Medical Decision Making  Medically screening exam initiated at 4:19 PM.  Appropriate orders placed.  Amber Sanford was informed that the remainder of the evaluation will be completed by another provider, this initial triage assessment does not replace that evaluation, and the importance of remaining in the ED until their evaluation is complete.     Duanne Guess, PA-C 08/19/21 1620

## 2021-08-19 NOTE — ED Notes (Signed)
Patient and family verbalized discharge understanding 

## 2021-08-19 NOTE — Discharge Instructions (Signed)
Your blood tests and CT scan are okay today.  Your urine test does reveal a bladder infection.  Take Keflex as prescribed for the next 5 days to resolve this issue, and follow-up with your doctor for further evaluation of your symptoms.

## 2021-08-22 LAB — URINE CULTURE: Culture: >=100000 — AB

## 2021-08-23 DIAGNOSIS — E46 Unspecified protein-calorie malnutrition: Secondary | ICD-10-CM | POA: Diagnosis not present

## 2021-08-23 DIAGNOSIS — N390 Urinary tract infection, site not specified: Secondary | ICD-10-CM | POA: Diagnosis not present

## 2021-08-23 DIAGNOSIS — M25552 Pain in left hip: Secondary | ICD-10-CM | POA: Diagnosis not present

## 2021-08-23 DIAGNOSIS — K625 Hemorrhage of anus and rectum: Secondary | ICD-10-CM | POA: Diagnosis not present

## 2021-08-23 DIAGNOSIS — R1110 Vomiting, unspecified: Secondary | ICD-10-CM | POA: Diagnosis not present

## 2021-08-23 DIAGNOSIS — R531 Weakness: Secondary | ICD-10-CM | POA: Diagnosis not present

## 2021-08-23 DIAGNOSIS — B9620 Unspecified Escherichia coli [E. coli] as the cause of diseases classified elsewhere: Secondary | ICD-10-CM | POA: Diagnosis not present

## 2021-08-23 DIAGNOSIS — R197 Diarrhea, unspecified: Secondary | ICD-10-CM | POA: Diagnosis not present

## 2021-08-25 DIAGNOSIS — H353211 Exudative age-related macular degeneration, right eye, with active choroidal neovascularization: Secondary | ICD-10-CM | POA: Diagnosis not present

## 2021-10-06 DIAGNOSIS — E46 Unspecified protein-calorie malnutrition: Secondary | ICD-10-CM | POA: Diagnosis not present

## 2021-10-06 DIAGNOSIS — I350 Nonrheumatic aortic (valve) stenosis: Secondary | ICD-10-CM | POA: Diagnosis not present

## 2021-10-06 DIAGNOSIS — I10 Essential (primary) hypertension: Secondary | ICD-10-CM | POA: Diagnosis not present

## 2021-10-06 DIAGNOSIS — E7849 Other hyperlipidemia: Secondary | ICD-10-CM | POA: Diagnosis not present

## 2021-10-06 DIAGNOSIS — R195 Other fecal abnormalities: Secondary | ICD-10-CM | POA: Diagnosis not present

## 2021-10-06 DIAGNOSIS — D649 Anemia, unspecified: Secondary | ICD-10-CM | POA: Diagnosis not present

## 2021-10-06 DIAGNOSIS — N39498 Other specified urinary incontinence: Secondary | ICD-10-CM | POA: Diagnosis not present

## 2021-10-06 DIAGNOSIS — R634 Abnormal weight loss: Secondary | ICD-10-CM | POA: Diagnosis not present

## 2021-10-06 DIAGNOSIS — I700 Atherosclerosis of aorta: Secondary | ICD-10-CM | POA: Diagnosis not present

## 2021-10-06 DIAGNOSIS — M1612 Unilateral primary osteoarthritis, left hip: Secondary | ICD-10-CM | POA: Diagnosis not present

## 2021-10-07 DIAGNOSIS — R195 Other fecal abnormalities: Secondary | ICD-10-CM | POA: Diagnosis not present

## 2021-10-07 DIAGNOSIS — R634 Abnormal weight loss: Secondary | ICD-10-CM | POA: Diagnosis not present

## 2021-10-11 ENCOUNTER — Ambulatory Visit: Payer: PPO | Admitting: Dermatology

## 2021-10-13 DIAGNOSIS — Z1211 Encounter for screening for malignant neoplasm of colon: Secondary | ICD-10-CM | POA: Diagnosis not present

## 2021-10-13 DIAGNOSIS — D649 Anemia, unspecified: Secondary | ICD-10-CM | POA: Diagnosis not present

## 2021-10-22 ENCOUNTER — Ambulatory Visit: Payer: Self-pay

## 2021-10-22 NOTE — Patient Instructions (Signed)
Visit Information  Thank you for taking time to visit with me today. Please don't hesitate to contact me if I can be of assistance to you.   Following are the goals we discussed today:   Goals Addressed             This Visit's Progress    RNCM: Find out why she is having loose stools       Care Coordination Interventions: Evaluation of current treatment plan related to GI issues with recent ED visit in June for weight loss, anemia and diarrhea and patient's adherence to plan as established by provider Advised patient to call the office for changes in her condition, worsening sx and sx of present GI issues, fever, chills, or other concerns related to GI concerns or chronic conditions Provided education to patient re: how the program works and the purpose of the call, the patient was reluctant to talk to the [facility] at first but agreed to calls by the end of the call. Discussed eating well, staying hydrated and monitoring for acute changes in her condition Reviewed scheduled/upcoming provider appointments including saw pcp on 10-06-2021 and has an appointment with GI specialist on 01-05-2022. The pcp is wanting the patient to have a sooner appointment.  Discussed plans with patient for ongoing care management follow up and provided patient with direct contact information for care management team Advised patient to discuss changes in her condition, questions, or concerns with provider Screening for signs and symptoms of depression related to chronic disease state  Assessed social determinant of health barriers 10-22-2021: The patient verbalized that she had an AWV with the provider already this year          Our next appointment is by telephone on 01-07-2022 at 330 pm  Please call the care guide team at [phone] if you need to cancel or reschedule your appointment.   If you are experiencing a Mental Health or [place] or need someone to talk to, please call the Suicide  and Crisis Lifeline: 988 call the [place] National Suicide Prevention Lifeline: (662)318-2071 or TTY: (651) 653-2447 TTY (657) 512-6891) to talk to a trained counselor call 1-800-273-TALK (toll free, 24 hour hotline)  The patient verbalized understanding of instructions, educational materials, and care plan provided today and DECLINED offer to receive copy of patient instructions, educational materials, and care plan.   Telephone follow up appointment with care management team member scheduled for: 01-07-2022 at 330 pm  [place], MSN, [place] [facility] Mobile: [phone]

## 2021-10-22 NOTE — Patient Outreach (Signed)
  Care Coordination   Initial Visit Note   10/22/2021 Name: Amber Sanford MRN: [id number] DOB: [birth year]  Amber Sanford is a 86 y.o. year old female who sees Tracie Harrier, MD for primary care. I spoke with  Amber Sanford by phone today  What matters to the patients health and wellness today?  Still having loose stools and testing to figure out what is going on    Goals Addressed             This Visit's Progress    RNCM: Find out why she is having loose stools       Care Coordination Interventions: Evaluation of current treatment plan related to GI issues with recent ED visit in June for weight loss, anemia and diarrhea and patient's adherence to plan as established by provider Advised patient to call the office for changes in her condition, worsening sx and sx of present GI issues, fever, chills, or other concerns related to GI concerns or chronic conditions Provided education to patient re: how the program works and the purpose of the call, the patient was reluctant to talk to the [facility] at first but agreed to calls by the end of the call. Discussed eating well, staying hydrated and monitoring for acute changes in her condition Reviewed scheduled/upcoming provider appointments including saw pcp on 10-06-2021 and has an appointment with GI specialist on 01-05-2022. The pcp is wanting the patient to have a sooner appointment.  Discussed plans with patient for ongoing care management follow up and provided patient with direct contact information for care management team Advised patient to discuss changes in her condition, questions, or concerns with provider Screening for signs and symptoms of depression related to chronic disease state  Assessed social determinant of health barriers 10-22-2021: The patient verbalized that she had an AWV with the provider already this year          SDOH assessments and interventions completed:  Yes  SDOH Interventions Today    Flowsheet Row  Most Recent Value  SDOH Interventions   Food Insecurity Interventions Intervention Not Indicated  Financial Strain Interventions Intervention Not Indicated  Housing Interventions Intervention Not Indicated  Transportation Interventions Intervention Not Indicated        Care Coordination Interventions Activated:  Yes  Care Coordination Interventions:  Yes, provided   Follow up plan: Follow up call scheduled for 01-07-2022    Encounter Outcome:  Pt. Visit Completed   Noreene Larsson RN, MSN, [place] [facility] Mobile: [phone]

## 2021-11-29 DIAGNOSIS — I10 Essential (primary) hypertension: Secondary | ICD-10-CM | POA: Diagnosis not present

## 2021-11-29 DIAGNOSIS — N390 Urinary tract infection, site not specified: Secondary | ICD-10-CM | POA: Diagnosis not present

## 2021-11-29 DIAGNOSIS — R7309 Other abnormal glucose: Secondary | ICD-10-CM | POA: Diagnosis not present

## 2021-11-29 DIAGNOSIS — M1612 Unilateral primary osteoarthritis, left hip: Secondary | ICD-10-CM | POA: Diagnosis not present

## 2021-11-29 DIAGNOSIS — R197 Diarrhea, unspecified: Secondary | ICD-10-CM | POA: Diagnosis not present

## 2021-11-29 DIAGNOSIS — H918X3 Other specified hearing loss, bilateral: Secondary | ICD-10-CM | POA: Diagnosis not present

## 2021-11-29 DIAGNOSIS — E7800 Pure hypercholesterolemia, unspecified: Secondary | ICD-10-CM | POA: Diagnosis not present

## 2021-11-29 DIAGNOSIS — K625 Hemorrhage of anus and rectum: Secondary | ICD-10-CM | POA: Diagnosis not present

## 2021-11-29 DIAGNOSIS — I2510 Atherosclerotic heart disease of native coronary artery without angina pectoris: Secondary | ICD-10-CM | POA: Diagnosis not present

## 2021-11-29 DIAGNOSIS — N343 Urethral syndrome, unspecified: Secondary | ICD-10-CM | POA: Diagnosis not present

## 2021-11-29 DIAGNOSIS — R531 Weakness: Secondary | ICD-10-CM | POA: Diagnosis not present

## 2021-11-29 DIAGNOSIS — B9620 Unspecified Escherichia coli [E. coli] as the cause of diseases classified elsewhere: Secondary | ICD-10-CM | POA: Diagnosis not present

## 2021-11-29 DIAGNOSIS — R1110 Vomiting, unspecified: Secondary | ICD-10-CM | POA: Diagnosis not present

## 2021-12-01 DIAGNOSIS — H353211 Exudative age-related macular degeneration, right eye, with active choroidal neovascularization: Secondary | ICD-10-CM | POA: Diagnosis not present

## 2022-01-05 DIAGNOSIS — R634 Abnormal weight loss: Secondary | ICD-10-CM | POA: Diagnosis not present

## 2022-01-05 DIAGNOSIS — D509 Iron deficiency anemia, unspecified: Secondary | ICD-10-CM | POA: Diagnosis not present

## 2022-01-05 DIAGNOSIS — R159 Full incontinence of feces: Secondary | ICD-10-CM | POA: Diagnosis not present

## 2022-01-07 ENCOUNTER — Ambulatory Visit: Payer: Self-pay

## 2022-01-07 NOTE — Patient Outreach (Signed)
  Care Coordination   Follow Up Visit Note   01/07/2022 Name: Amber Sanford MRN: [id number] DOB: 1936-01-09  Amber Sanford is a 86 y.o. year old female who sees Amber Harrier, MD for primary care. I spoke with  Amber Sanford by phone today.  What matters to the patients health and wellness today?  No new concerns, does not wish to get additional calls.     Goals Addressed             This Visit's Progress    COMPLETED: RNCM: Find out why she is having loose stools       Care Coordination Interventions: 01-07-2022: The patient denies any new concerns and does not wish to receive additional calls. Closing plan of care. Evaluation of current treatment plan related to GI issues with recent ED visit in June for weight loss, anemia and diarrhea and patient's adherence to plan as established by provider Advised patient to call the office for changes in her condition, worsening sx and sx of present GI issues, fever, chills, or other concerns related to GI concerns or chronic conditions Provided education to patient re: how the program works and the purpose of the call, the patient was reluctant to talk to the [name] at first but agreed to calls by the end of the call. Discussed eating well, staying hydrated and monitoring for acute changes in her condition Reviewed scheduled/upcoming provider appointments including saw pcp on 10-06-2021 and has an appointment with GI specialist on 01-05-2022. The pcp is wanting the patient to have a sooner appointment.  Discussed plans with patient for ongoing care management follow up and provided patient with direct contact information for care management team Advised patient to discuss changes in her condition, questions, or concerns with provider Screening for signs and symptoms of depression related to chronic disease state  Assessed social determinant of health barriers 10-22-2021: The patient verbalized that she had an AWV with the provider already this  year          SDOH assessments and interventions completed:  Yes  SDOH Interventions Today    Flowsheet Row Most Recent Value  SDOH Interventions   Utilities Interventions Intervention Not Indicated        Care Coordination Interventions Activated:  Yes  Care Coordination Interventions:  Yes, provided   Follow up plan: No further intervention required.   Encounter Outcome:  Pt. Visit Completed  Amber Larsson RN, MSN, [place]  Mobile: [phone]

## 2022-01-07 NOTE — Patient Instructions (Signed)
Visit Information  Thank you for taking time to visit with me today. Please don't hesitate to contact me if I can be of assistance to you.   Following are the goals we discussed today:   Goals Addressed             This Visit's Progress    COMPLETED: RNCM: Find out why she is having loose stools       Care Coordination Interventions: 01-07-2022: The patient denies any new concerns and does not wish to receive additional calls. Closing plan of care. Evaluation of current treatment plan related to GI issues with recent ED visit in June for weight loss, anemia and diarrhea and patient's adherence to plan as established by provider Advised patient to call the office for changes in her condition, worsening sx and sx of present GI issues, fever, chills, or other concerns related to GI concerns or chronic conditions Provided education to patient re: how the program works and the purpose of the call, the patient was reluctant to talk to the [facility] at first but agreed to calls by the end of the call. Discussed eating well, staying hydrated and monitoring for acute changes in her condition Reviewed scheduled/upcoming provider appointments including saw pcp on 10-06-2021 and has an appointment with GI specialist on 01-05-2022. The pcp is wanting the patient to have a sooner appointment.  Discussed plans with patient for ongoing care management follow up and provided patient with direct contact information for care management team Advised patient to discuss changes in her condition, questions, or concerns with provider Screening for signs and symptoms of depression related to chronic disease state  Assessed social determinant of health barriers 10-22-2021: The patient verbalized that she had an AWV with the provider already this year            Please call the care guide team at [phone] if you need to schedule an appointment.   If you are experiencing a Mental Health or [place]  or need someone to talk to, please call the Suicide and Crisis Lifeline: 988 call the [place] National Suicide Prevention Lifeline: (431) 785-9331 or TTY: 502-602-7583 TTY (813)511-7523) to talk to a trained counselor call 1-800-273-TALK (toll free, 24 hour hotline)  The patient verbalized understanding of instructions, educational materials, and care plan provided today and DECLINED offer to receive copy of patient instructions, educational materials, and care plan.   No further follow up required: the patient does not wish to get additional calls Noreene Larsson RN, MSN, [facility]  Mobile: [phone]

## 2022-03-09 DIAGNOSIS — H353211 Exudative age-related macular degeneration, right eye, with active choroidal neovascularization: Secondary | ICD-10-CM | POA: Diagnosis not present

## 2022-04-01 DIAGNOSIS — M1612 Unilateral primary osteoarthritis, left hip: Secondary | ICD-10-CM | POA: Diagnosis not present

## 2022-04-01 DIAGNOSIS — I700 Atherosclerosis of aorta: Secondary | ICD-10-CM | POA: Diagnosis not present

## 2022-04-01 DIAGNOSIS — R790 Abnormal level of blood mineral: Secondary | ICD-10-CM | POA: Diagnosis not present

## 2022-04-01 DIAGNOSIS — I10 Essential (primary) hypertension: Secondary | ICD-10-CM | POA: Diagnosis not present

## 2022-04-01 DIAGNOSIS — D649 Anemia, unspecified: Secondary | ICD-10-CM | POA: Diagnosis not present

## 2022-04-01 DIAGNOSIS — K649 Unspecified hemorrhoids: Secondary | ICD-10-CM | POA: Diagnosis not present

## 2022-04-01 DIAGNOSIS — R42 Dizziness and giddiness: Secondary | ICD-10-CM | POA: Diagnosis not present

## 2022-04-01 DIAGNOSIS — R634 Abnormal weight loss: Secondary | ICD-10-CM | POA: Diagnosis not present

## 2022-04-01 DIAGNOSIS — H9193 Unspecified hearing loss, bilateral: Secondary | ICD-10-CM | POA: Diagnosis not present

## 2022-04-01 DIAGNOSIS — Z0000 Encounter for general adult medical examination without abnormal findings: Secondary | ICD-10-CM | POA: Diagnosis not present

## 2022-04-01 DIAGNOSIS — H3530 Unspecified macular degeneration: Secondary | ICD-10-CM | POA: Diagnosis not present

## 2022-04-01 DIAGNOSIS — E46 Unspecified protein-calorie malnutrition: Secondary | ICD-10-CM | POA: Diagnosis not present

## 2022-04-15 ENCOUNTER — Ambulatory Visit: Admit: 2022-04-15 | Payer: PPO | Admitting: Gastroenterology

## 2022-04-15 SURGERY — COLONOSCOPY
Anesthesia: General

## 2022-04-25 DIAGNOSIS — H353211 Exudative age-related macular degeneration, right eye, with active choroidal neovascularization: Secondary | ICD-10-CM | POA: Diagnosis not present

## 2022-04-25 DIAGNOSIS — H353124 Nonexudative age-related macular degeneration, left eye, advanced atrophic with subfoveal involvement: Secondary | ICD-10-CM | POA: Diagnosis not present

## 2022-07-29 DIAGNOSIS — I700 Atherosclerosis of aorta: Secondary | ICD-10-CM | POA: Diagnosis not present

## 2022-07-29 DIAGNOSIS — Z0000 Encounter for general adult medical examination without abnormal findings: Secondary | ICD-10-CM | POA: Diagnosis not present

## 2022-07-29 DIAGNOSIS — R790 Abnormal level of blood mineral: Secondary | ICD-10-CM | POA: Diagnosis not present

## 2022-07-29 DIAGNOSIS — H3530 Unspecified macular degeneration: Secondary | ICD-10-CM | POA: Diagnosis not present

## 2022-07-29 DIAGNOSIS — R634 Abnormal weight loss: Secondary | ICD-10-CM | POA: Diagnosis not present

## 2022-07-29 DIAGNOSIS — H9193 Unspecified hearing loss, bilateral: Secondary | ICD-10-CM | POA: Diagnosis not present

## 2022-07-29 DIAGNOSIS — M1612 Unilateral primary osteoarthritis, left hip: Secondary | ICD-10-CM | POA: Diagnosis not present

## 2022-07-29 DIAGNOSIS — R42 Dizziness and giddiness: Secondary | ICD-10-CM | POA: Diagnosis not present

## 2022-07-29 DIAGNOSIS — I10 Essential (primary) hypertension: Secondary | ICD-10-CM | POA: Diagnosis not present

## 2022-07-29 DIAGNOSIS — D649 Anemia, unspecified: Secondary | ICD-10-CM | POA: Diagnosis not present

## 2022-08-05 DIAGNOSIS — R790 Abnormal level of blood mineral: Secondary | ICD-10-CM | POA: Diagnosis not present

## 2022-08-05 DIAGNOSIS — R42 Dizziness and giddiness: Secondary | ICD-10-CM | POA: Diagnosis not present

## 2022-08-05 DIAGNOSIS — M1612 Unilateral primary osteoarthritis, left hip: Secondary | ICD-10-CM | POA: Diagnosis not present

## 2022-08-05 DIAGNOSIS — H9193 Unspecified hearing loss, bilateral: Secondary | ICD-10-CM | POA: Diagnosis not present

## 2022-08-05 DIAGNOSIS — G8929 Other chronic pain: Secondary | ICD-10-CM | POA: Diagnosis not present

## 2022-08-05 DIAGNOSIS — D649 Anemia, unspecified: Secondary | ICD-10-CM | POA: Diagnosis not present

## 2022-08-05 DIAGNOSIS — R5383 Other fatigue: Secondary | ICD-10-CM | POA: Diagnosis not present

## 2022-08-05 DIAGNOSIS — R634 Abnormal weight loss: Secondary | ICD-10-CM | POA: Diagnosis not present

## 2022-08-05 DIAGNOSIS — R5381 Other malaise: Secondary | ICD-10-CM | POA: Diagnosis not present

## 2022-08-05 DIAGNOSIS — M5440 Lumbago with sciatica, unspecified side: Secondary | ICD-10-CM | POA: Diagnosis not present

## 2022-08-05 DIAGNOSIS — I10 Essential (primary) hypertension: Secondary | ICD-10-CM | POA: Diagnosis not present

## 2022-08-05 DIAGNOSIS — R262 Difficulty in walking, not elsewhere classified: Secondary | ICD-10-CM | POA: Diagnosis not present

## 2022-08-08 ENCOUNTER — Other Ambulatory Visit: Payer: Self-pay | Admitting: Internal Medicine

## 2022-08-08 DIAGNOSIS — R634 Abnormal weight loss: Secondary | ICD-10-CM

## 2022-08-08 DIAGNOSIS — D649 Anemia, unspecified: Secondary | ICD-10-CM

## 2022-08-19 ENCOUNTER — Ambulatory Visit
Admission: RE | Admit: 2022-08-19 | Discharge: 2022-08-19 | Disposition: A | Discharge status: Home / Self Care | Payer: PPO | Source: Ambulatory Visit | Attending: Internal Medicine | Admitting: Internal Medicine

## 2022-08-19 DIAGNOSIS — R634 Abnormal weight loss: Secondary | ICD-10-CM | POA: Diagnosis not present

## 2022-08-19 DIAGNOSIS — D649 Anemia, unspecified: Secondary | ICD-10-CM | POA: Diagnosis not present

## 2022-08-19 DIAGNOSIS — N281 Cyst of kidney, acquired: Secondary | ICD-10-CM | POA: Diagnosis not present

## 2022-08-19 DIAGNOSIS — K8020 Calculus of gallbladder without cholecystitis without obstruction: Secondary | ICD-10-CM | POA: Diagnosis not present

## 2022-08-19 DIAGNOSIS — K5730 Diverticulosis of large intestine without perforation or abscess without bleeding: Secondary | ICD-10-CM | POA: Diagnosis not present

## 2022-08-19 MED ORDER — IOHEXOL 300 MG/ML  SOLN: 80.0000 mL | Freq: Once | INTRAMUSCULAR | Status: DC | PRN

## 2022-08-19 MED ORDER — IOHEXOL 300 MG/ML  SOLN
75.0000 mL | Freq: Once | INTRAMUSCULAR | Status: AC | PRN
  Administered 2022-08-19: 75 mL via INTRAVENOUS

## 2022-09-12 IMAGING — CR DG CHEST 2V
1 series · 3 of 3 positions shown · non-contrast
Comparison: Radiographs 01/26/2009.

CLINICAL DATA: Chest pain radiating into the right arm. History of
MI.

EXAM:
CHEST - 2 VIEW

[Series 1: dg chest 2 view · 0.14mm/px · 3 of 3 slices shown]
[im 1/3]
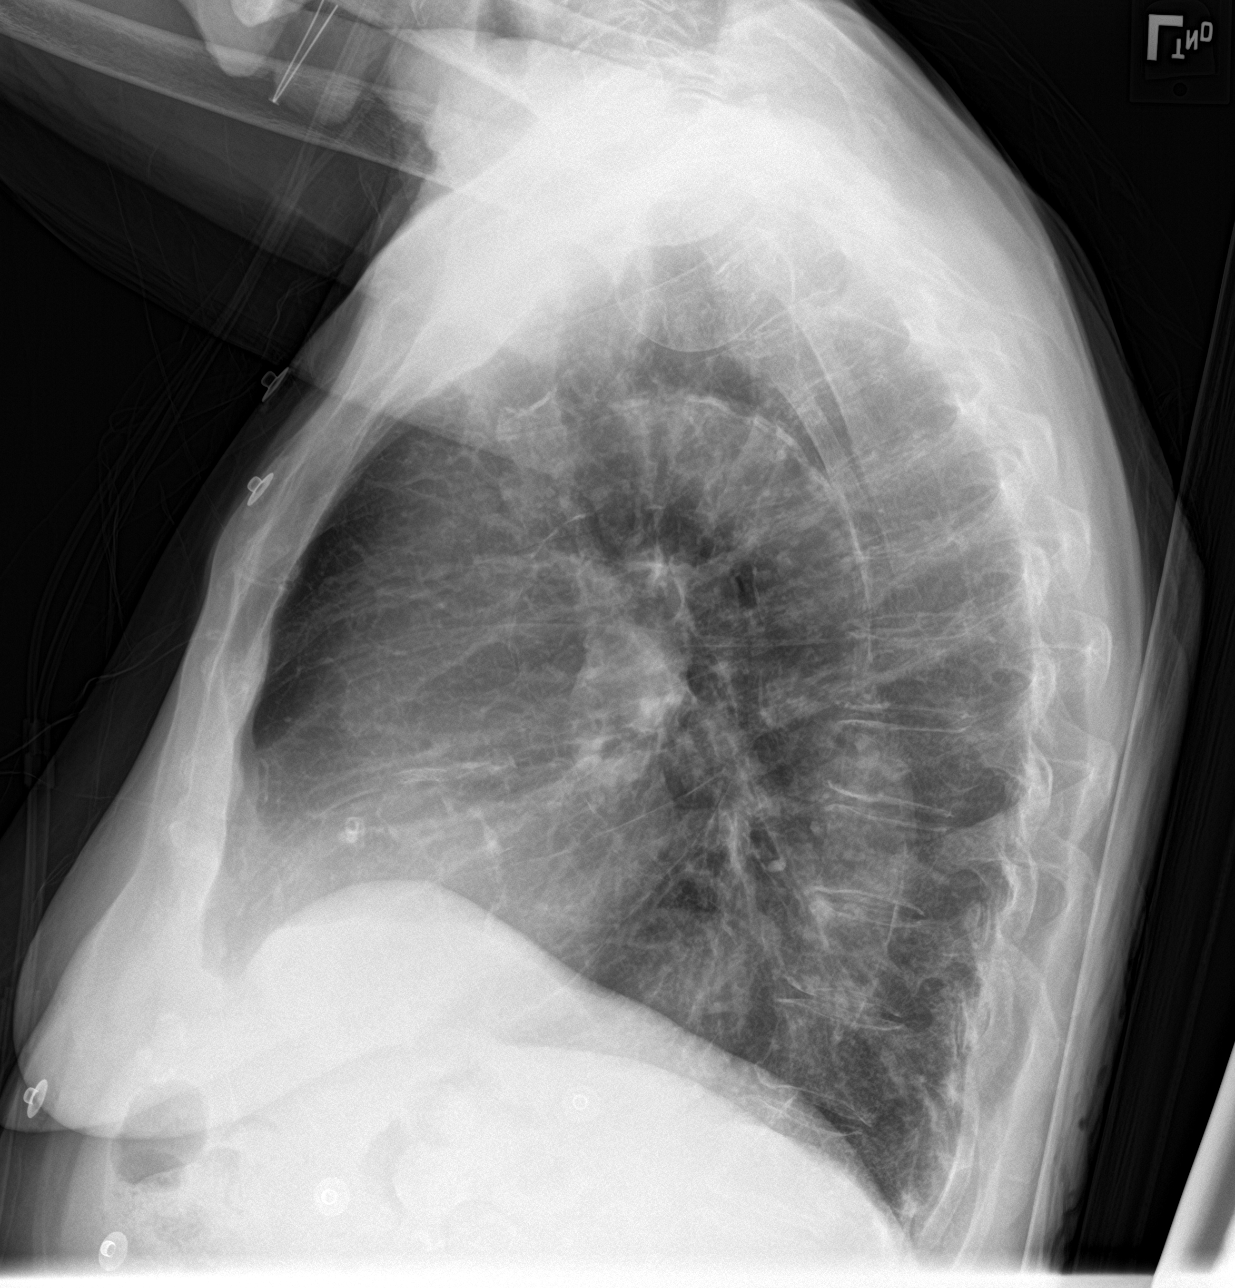
[im 2/3]
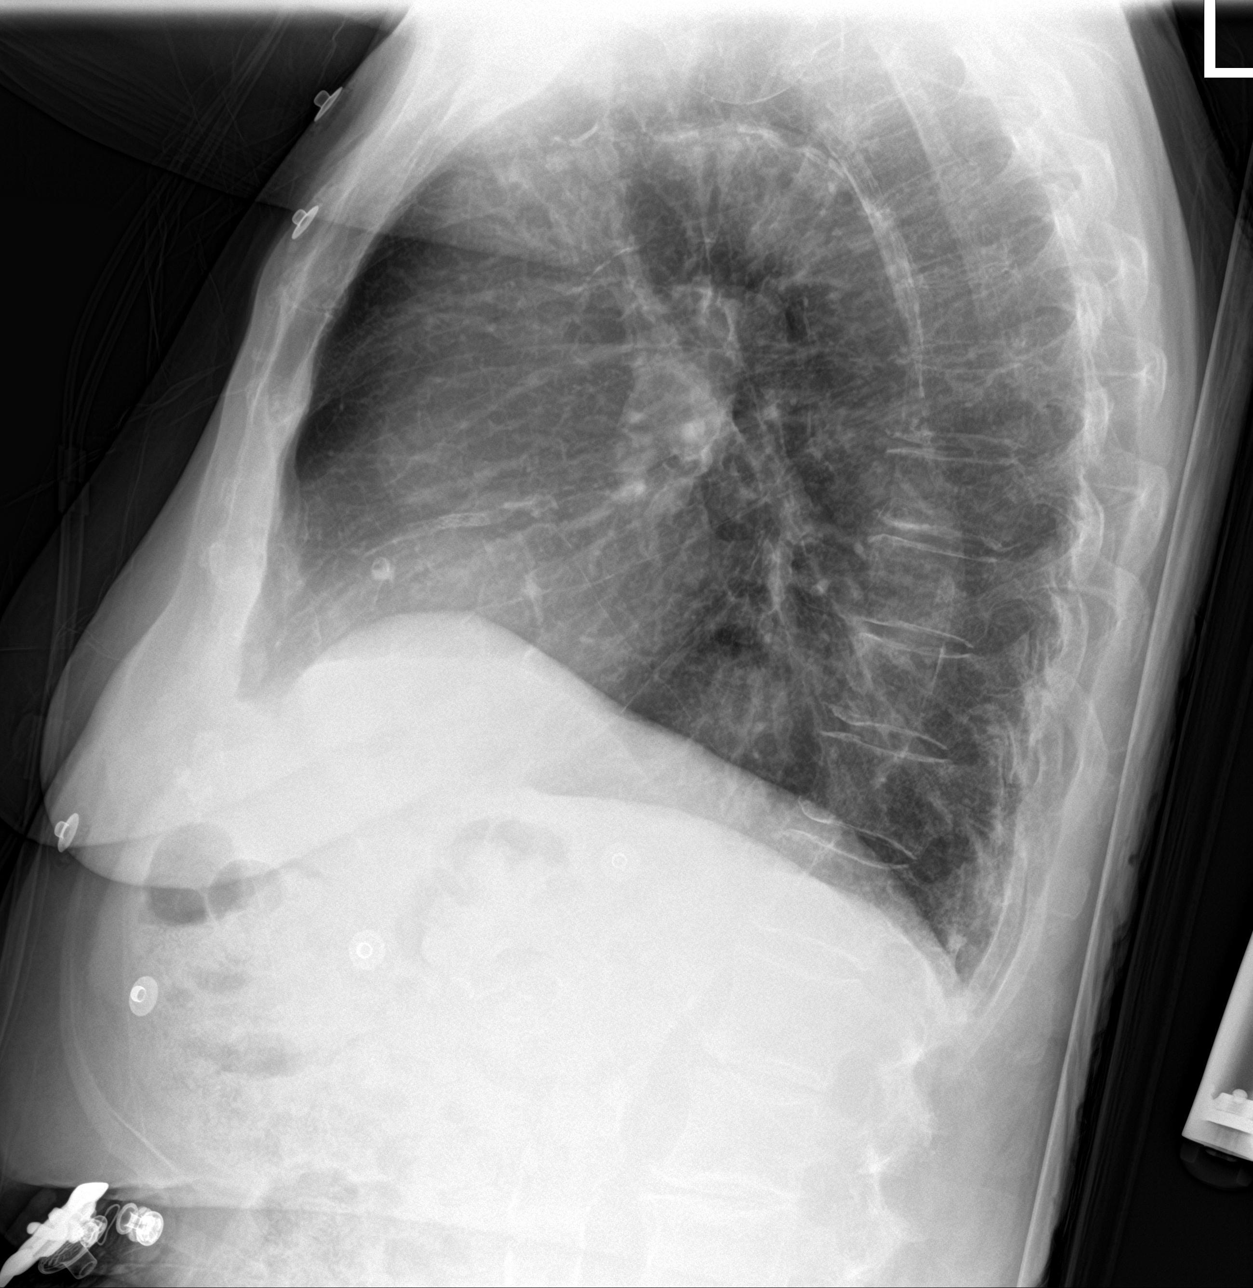
[im 3/3]
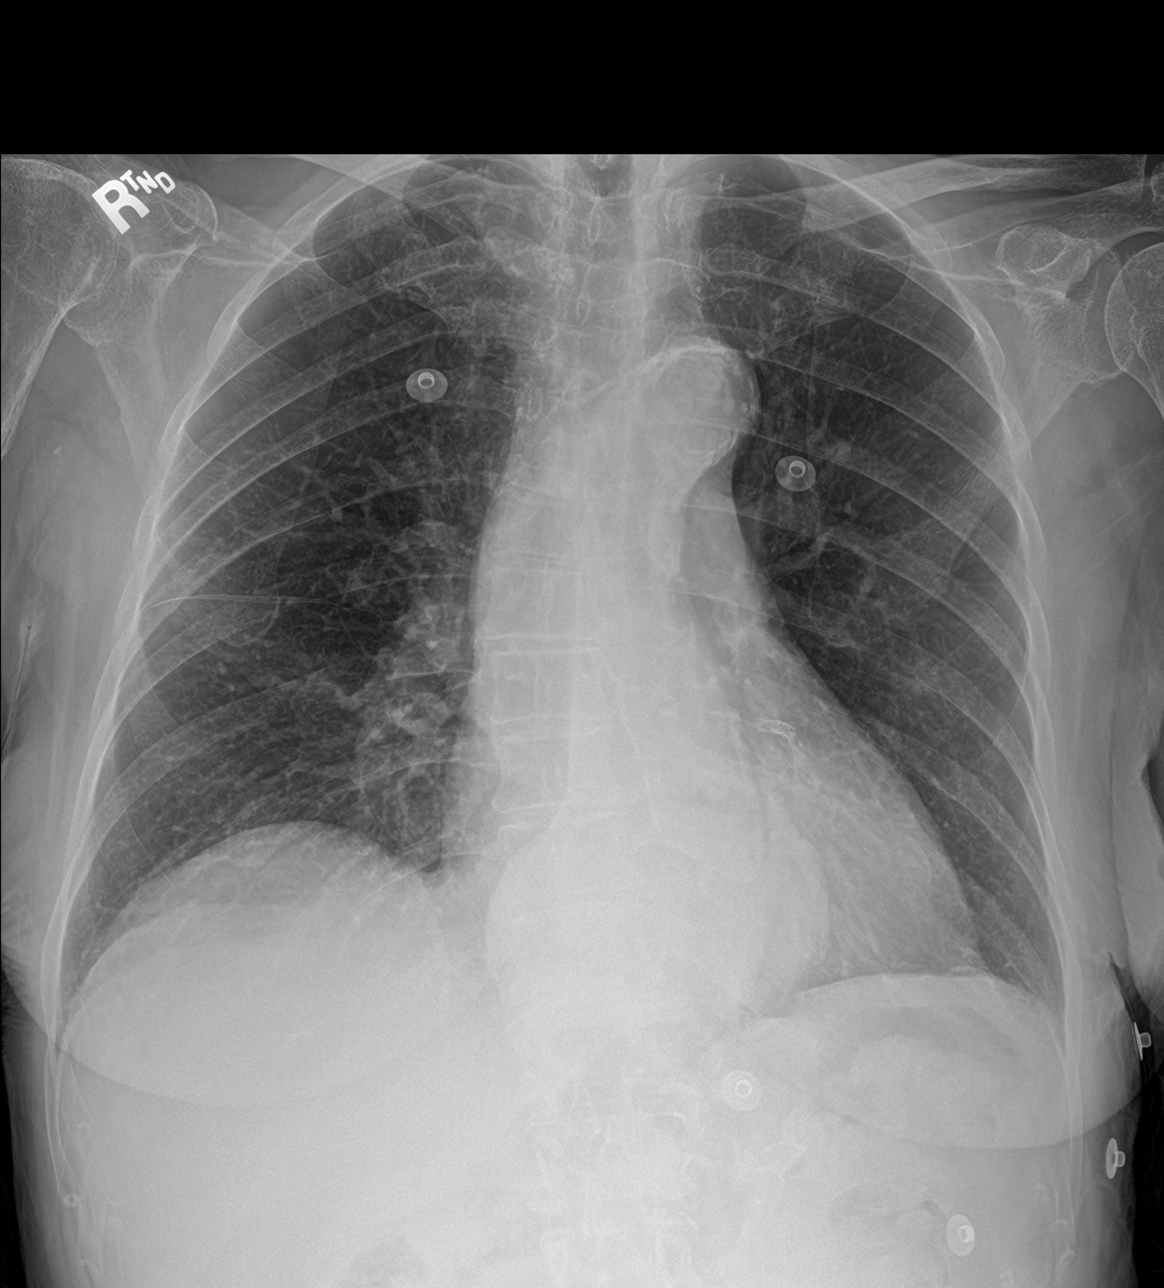

[3 of 3 positions shown; findings below may reference images not displayed]

FINDINGS: The heart size is stable. There is aortic and coronary artery
atherosclerosis with a coronary artery stent. There is a moderate
size hiatal hernia. The lungs appear clear. There is no pleural
effusion or pneumothorax. No acute osseous findings are evident.
Mild degenerative changes are present in the spine.
IMPRESSION: No evidence of active cardiopulmonary process. Aortic and coronary
artery atherosclerosis.

## 2022-11-10 DIAGNOSIS — H353212 Exudative age-related macular degeneration, right eye, with inactive choroidal neovascularization: Secondary | ICD-10-CM | POA: Diagnosis not present

## 2022-11-10 DIAGNOSIS — H353124 Nonexudative age-related macular degeneration, left eye, advanced atrophic with subfoveal involvement: Secondary | ICD-10-CM | POA: Diagnosis not present

## 2022-11-10 DIAGNOSIS — H26491 Other secondary cataract, right eye: Secondary | ICD-10-CM | POA: Diagnosis not present

## 2022-11-10 DIAGNOSIS — Z961 Presence of intraocular lens: Secondary | ICD-10-CM | POA: Diagnosis not present

## 2022-12-06 DIAGNOSIS — E7800 Pure hypercholesterolemia, unspecified: Secondary | ICD-10-CM | POA: Diagnosis not present

## 2022-12-06 DIAGNOSIS — I10 Essential (primary) hypertension: Secondary | ICD-10-CM | POA: Diagnosis not present

## 2022-12-06 DIAGNOSIS — Z955 Presence of coronary angioplasty implant and graft: Secondary | ICD-10-CM | POA: Diagnosis not present

## 2022-12-06 DIAGNOSIS — I2510 Atherosclerotic heart disease of native coronary artery without angina pectoris: Secondary | ICD-10-CM | POA: Diagnosis not present

## 2022-12-06 DIAGNOSIS — Z23 Encounter for immunization: Secondary | ICD-10-CM | POA: Diagnosis not present

## 2022-12-06 DIAGNOSIS — I350 Nonrheumatic aortic (valve) stenosis: Secondary | ICD-10-CM | POA: Diagnosis not present

## 2022-12-23 DIAGNOSIS — I350 Nonrheumatic aortic (valve) stenosis: Secondary | ICD-10-CM | POA: Diagnosis not present

## 2023-01-17 DIAGNOSIS — R8290 Unspecified abnormal findings in urine: Secondary | ICD-10-CM | POA: Diagnosis not present

## 2023-01-17 DIAGNOSIS — R399 Unspecified symptoms and signs involving the genitourinary system: Secondary | ICD-10-CM | POA: Diagnosis not present

## 2023-03-19 IMAGING — CT CT ABD-PELV W/ CM
2 of 5 series · 15 of 46 positions shown, 17 images · IV contrast (APPLIED)
Comparison: 07/15/2018

CLINICAL DATA: Left lower quadrant abdominal pain

EXAM:
CT ABDOMEN AND PELVIS WITH CONTRAST
TECHNIQUE: Multidetector CT imaging of the abdomen and pelvis was performed
using the standard protocol following bolus administration of
intravenous contrast.

[Series 2: abdomen 5.0 · axial · 0.69mm/px · z∈[-1017,-617]mm · 12 of 96 slices shown, 14 images]
[im 8/96  soft-tissue]
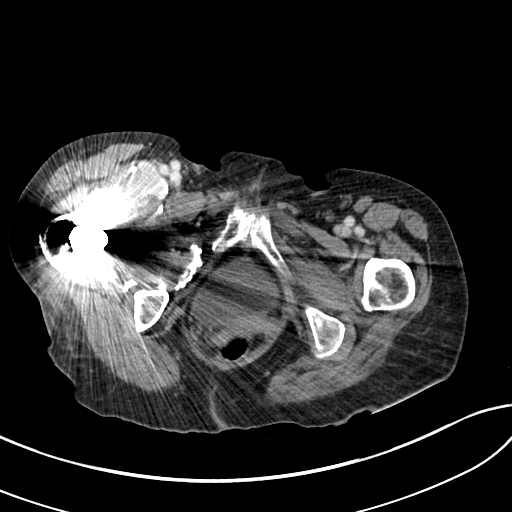
[im 8/96  bone]
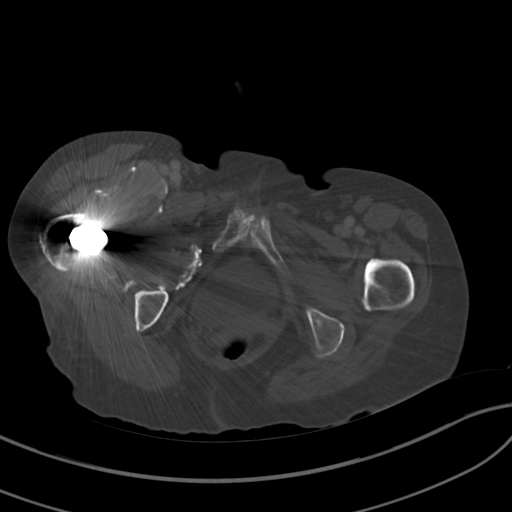
[im 15/96  soft-tissue]
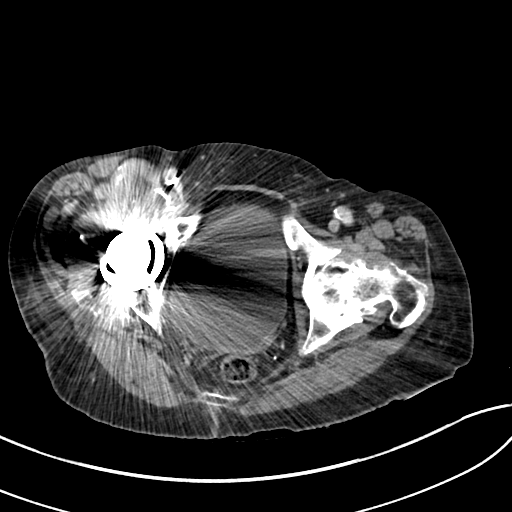
[im 22/96  soft-tissue]
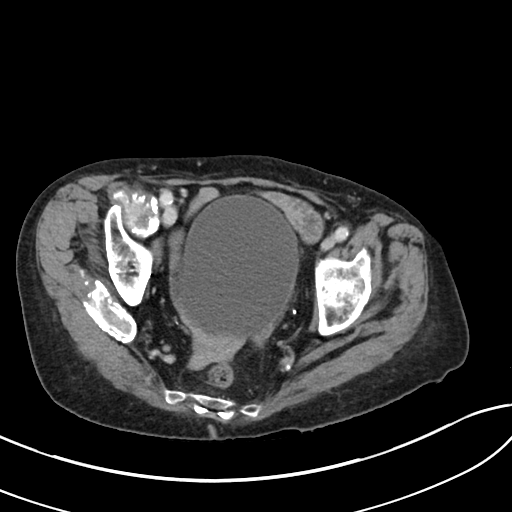
[im 30/96  soft-tissue]
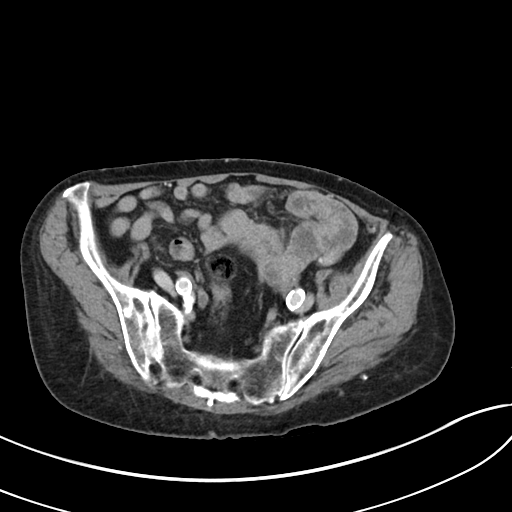
[im 37/96  soft-tissue]
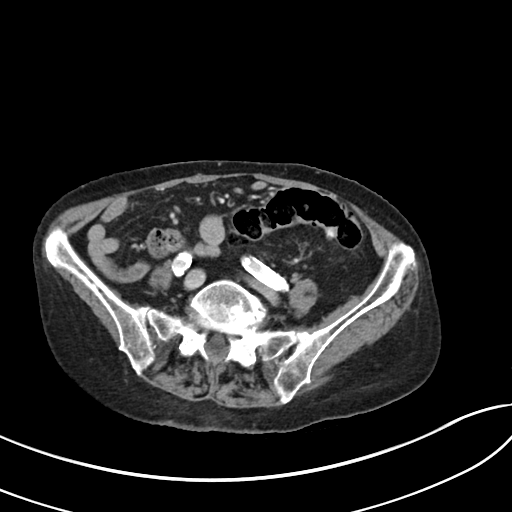
[im 44/96  soft-tissue]
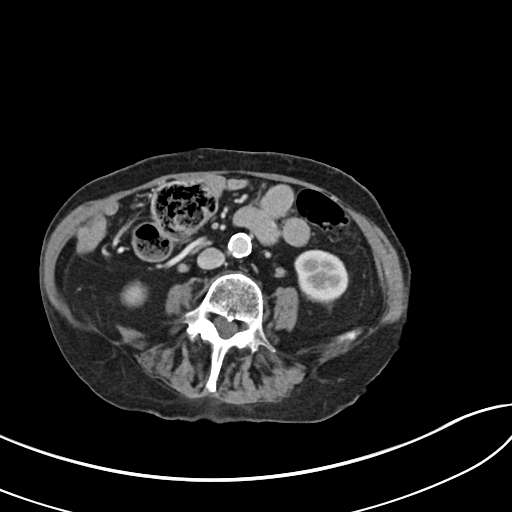
[im 52/96  soft-tissue]
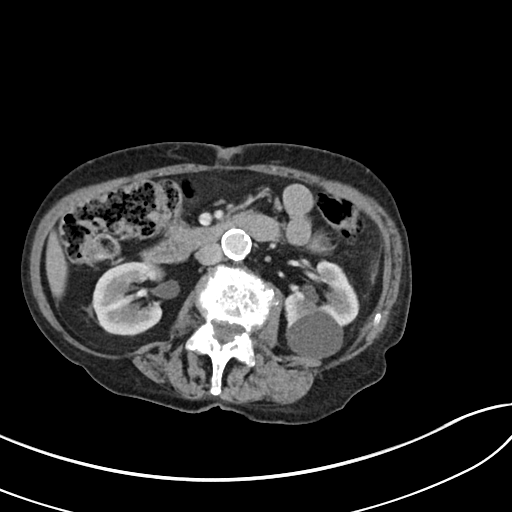
[im 59/96  soft-tissue]
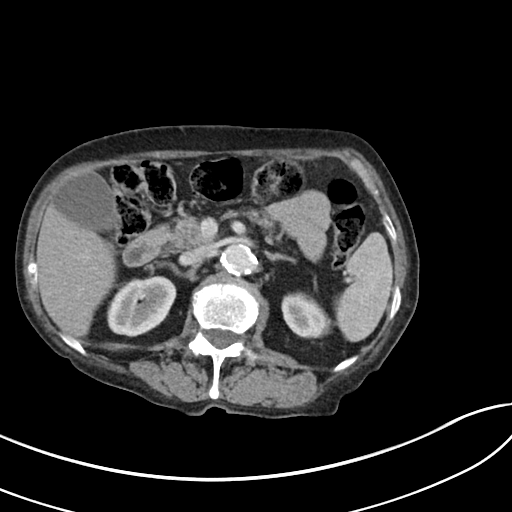
[im 66/96  soft-tissue]
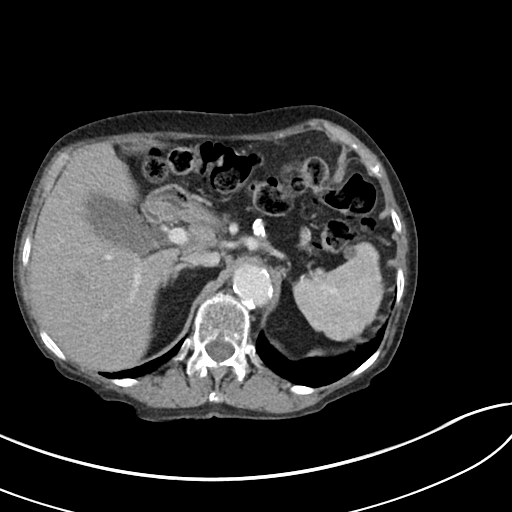
[im 66/96  bone]
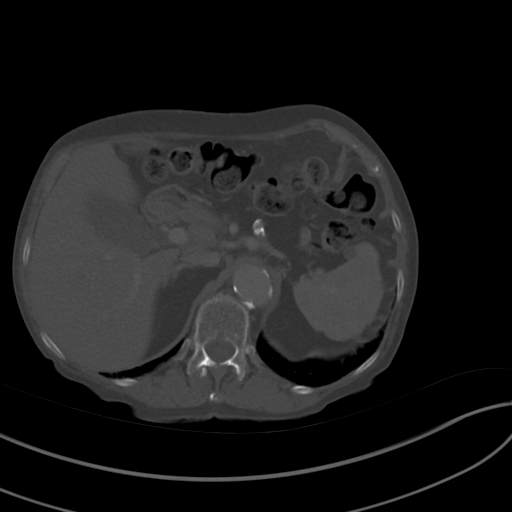
[im 74/96  soft-tissue]
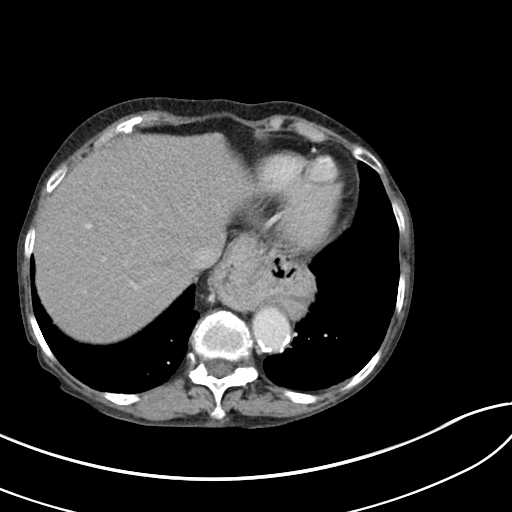
[im 81/96  soft-tissue]
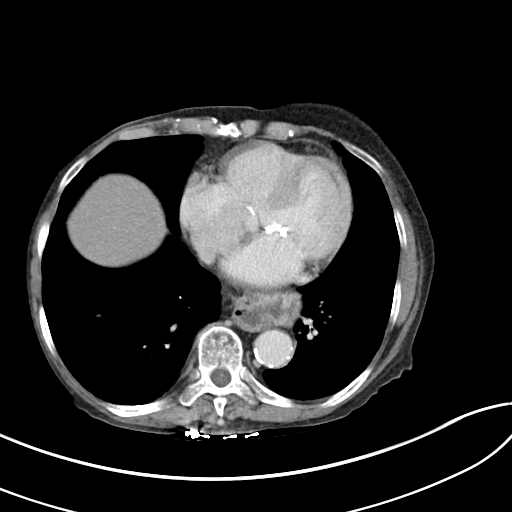
[im 88/96  soft-tissue]
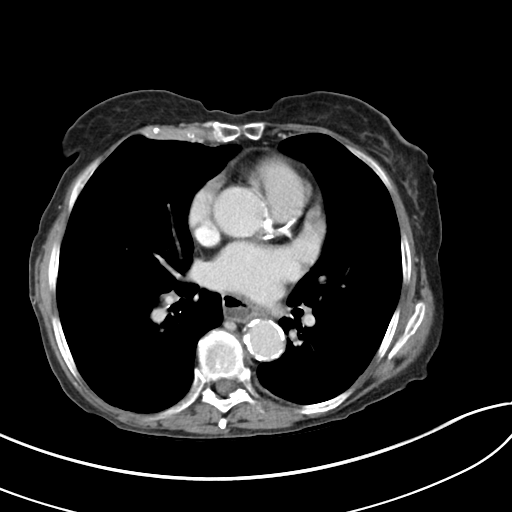

[Series 5: abdomen 3.0 mpr cor · coronal · 0.66mm/px · 3 of 86 slices shown]
[im 29/86  soft-tissue]
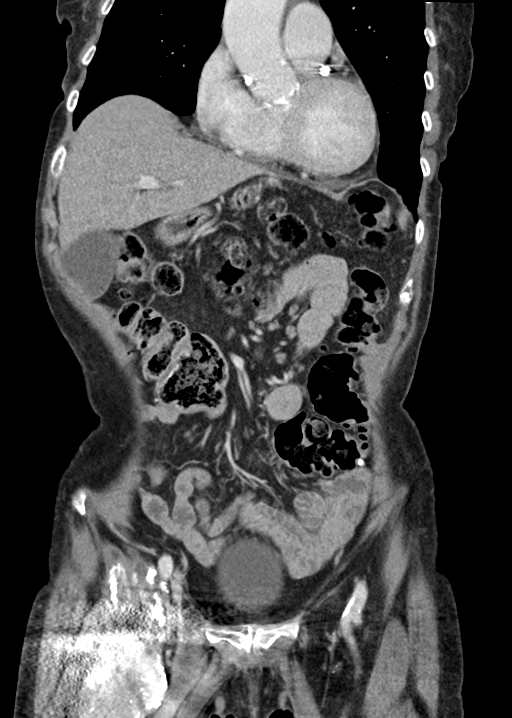
[im 38/86  soft-tissue]
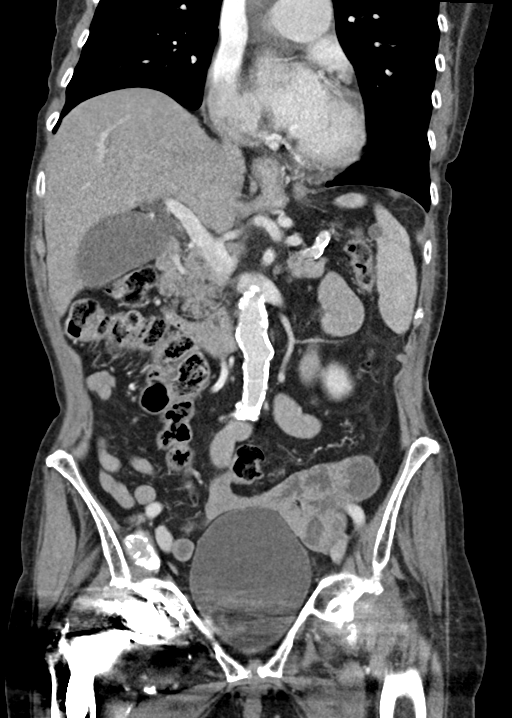
[im 48/86  soft-tissue]
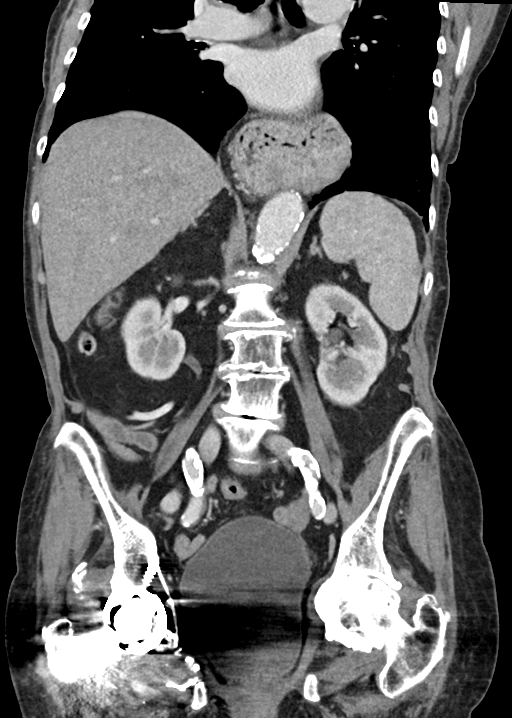

[15 of 46 positions shown; findings below may reference images not displayed]

RADIATION DOSE REDUCTION: This exam was performed according to the
departmental dose-optimization program which includes automated
exposure control, adjustment of the mA and/or kV according to
patient size and/or use of iterative reconstruction technique.

CONTRAST:  100mL OMNIPAQUE IOHEXOL 300 MG/ML  SOLN
FINDINGS: Lower chest: Coronary artery and aortic calcifications. Large hiatal
hernia. No acute findings.

Hepatobiliary: Layering gallstones within the gallbladder. No
biliary ductal dilatation. No focal hepatic abnormality.

Pancreas: No focal abnormality or ductal dilatation.

Spleen: No focal abnormality.  Normal size.

Adrenals/Urinary Tract: 3.7 cm cyst in the midpole of the left
kidney. Left renal parapelvic cysts. Small cysts in the right
kidney. No stones or hydronephrosis. Adrenal glands and urinary
bladder unremarkable.

Stomach/Bowel: Colonic diverticulosis. No active diverticulitis.
Stomach and small bowel grossly unremarkable.

Vascular/Lymphatic: Heavily calcified aorta and iliac vessels. No
evidence of aneurysm or adenopathy.

Reproductive: Prior hysterectomy.  No adnexal masses.

Other: No free fluid or free air.

Musculoskeletal: Prior right hip replacement. Large soft tissue mass
associated with the right hip with calcifications again noted,
unchanged since prior study. Advanced degenerative changes in the
left hip. Degenerative changes in the lumbar spine. No acute bony
abnormality.
IMPRESSION: Cholelithiasis.  No CT evidence of acute cholecystitis.

Large hiatal hernia.

Colonic diverticulosis.  No active diverticulitis.

Diffuse aero iliac atherosclerosis.  Coronary artery disease.

No acute findings in the abdomen or pelvis.

Stable soft tissue mass associated with the right hip and right hip
replacement with stippled calcifications. This may be related to
particle disease, heterotopic calcification or synovitis. This is
unchanged since prior study.

## 2023-04-05 DIAGNOSIS — I2510 Atherosclerotic heart disease of native coronary artery without angina pectoris: Secondary | ICD-10-CM | POA: Diagnosis not present

## 2023-04-05 DIAGNOSIS — Z955 Presence of coronary angioplasty implant and graft: Secondary | ICD-10-CM | POA: Diagnosis not present

## 2023-04-05 DIAGNOSIS — I700 Atherosclerosis of aorta: Secondary | ICD-10-CM | POA: Diagnosis not present

## 2023-04-05 DIAGNOSIS — I350 Nonrheumatic aortic (valve) stenosis: Secondary | ICD-10-CM | POA: Diagnosis not present

## 2023-04-05 DIAGNOSIS — Z23 Encounter for immunization: Secondary | ICD-10-CM | POA: Diagnosis not present

## 2023-04-05 DIAGNOSIS — I10 Essential (primary) hypertension: Secondary | ICD-10-CM | POA: Diagnosis not present

## 2023-04-05 DIAGNOSIS — E7800 Pure hypercholesterolemia, unspecified: Secondary | ICD-10-CM | POA: Diagnosis not present

## 2023-04-11 DIAGNOSIS — M13811 Other specified arthritis, right shoulder: Secondary | ICD-10-CM | POA: Diagnosis not present

## 2023-04-12 DIAGNOSIS — M25511 Pain in right shoulder: Secondary | ICD-10-CM | POA: Diagnosis not present

## 2023-04-12 DIAGNOSIS — M13811 Other specified arthritis, right shoulder: Secondary | ICD-10-CM | POA: Diagnosis not present

## 2023-04-14 DIAGNOSIS — D649 Anemia, unspecified: Secondary | ICD-10-CM | POA: Diagnosis not present

## 2023-04-14 DIAGNOSIS — R790 Abnormal level of blood mineral: Secondary | ICD-10-CM | POA: Diagnosis not present

## 2023-04-14 DIAGNOSIS — H9193 Unspecified hearing loss, bilateral: Secondary | ICD-10-CM | POA: Diagnosis not present

## 2023-04-14 DIAGNOSIS — I10 Essential (primary) hypertension: Secondary | ICD-10-CM | POA: Diagnosis not present

## 2023-04-14 DIAGNOSIS — R399 Unspecified symptoms and signs involving the genitourinary system: Secondary | ICD-10-CM | POA: Diagnosis not present

## 2023-04-14 DIAGNOSIS — R634 Abnormal weight loss: Secondary | ICD-10-CM | POA: Diagnosis not present

## 2023-04-14 DIAGNOSIS — R42 Dizziness and giddiness: Secondary | ICD-10-CM | POA: Diagnosis not present

## 2023-04-14 DIAGNOSIS — R8290 Unspecified abnormal findings in urine: Secondary | ICD-10-CM | POA: Diagnosis not present

## 2023-04-14 DIAGNOSIS — M1612 Unilateral primary osteoarthritis, left hip: Secondary | ICD-10-CM | POA: Diagnosis not present

## 2023-04-14 DIAGNOSIS — M5440 Lumbago with sciatica, unspecified side: Secondary | ICD-10-CM | POA: Diagnosis not present

## 2023-04-14 DIAGNOSIS — G8929 Other chronic pain: Secondary | ICD-10-CM | POA: Diagnosis not present

## 2023-04-21 DIAGNOSIS — K8020 Calculus of gallbladder without cholecystitis without obstruction: Secondary | ICD-10-CM | POA: Diagnosis not present

## 2023-04-21 DIAGNOSIS — M25552 Pain in left hip: Secondary | ICD-10-CM | POA: Diagnosis not present

## 2023-04-21 DIAGNOSIS — N390 Urinary tract infection, site not specified: Secondary | ICD-10-CM | POA: Diagnosis not present

## 2023-04-21 DIAGNOSIS — M1612 Unilateral primary osteoarthritis, left hip: Secondary | ICD-10-CM | POA: Diagnosis not present

## 2023-04-21 DIAGNOSIS — R262 Difficulty in walking, not elsewhere classified: Secondary | ICD-10-CM | POA: Diagnosis not present

## 2023-04-21 DIAGNOSIS — K449 Diaphragmatic hernia without obstruction or gangrene: Secondary | ICD-10-CM | POA: Diagnosis not present

## 2023-04-21 DIAGNOSIS — I700 Atherosclerosis of aorta: Secondary | ICD-10-CM | POA: Diagnosis not present

## 2023-04-21 DIAGNOSIS — R42 Dizziness and giddiness: Secondary | ICD-10-CM | POA: Diagnosis not present

## 2023-04-21 DIAGNOSIS — H9193 Unspecified hearing loss, bilateral: Secondary | ICD-10-CM | POA: Diagnosis not present

## 2023-04-21 DIAGNOSIS — I10 Essential (primary) hypertension: Secondary | ICD-10-CM | POA: Diagnosis not present

## 2023-04-21 DIAGNOSIS — R790 Abnormal level of blood mineral: Secondary | ICD-10-CM | POA: Diagnosis not present

## 2023-04-21 DIAGNOSIS — K5790 Diverticulosis of intestine, part unspecified, without perforation or abscess without bleeding: Secondary | ICD-10-CM | POA: Diagnosis not present

## 2023-05-19 DIAGNOSIS — M25552 Pain in left hip: Secondary | ICD-10-CM | POA: Diagnosis not present

## 2023-05-19 DIAGNOSIS — M7062 Trochanteric bursitis, left hip: Secondary | ICD-10-CM | POA: Diagnosis not present

## 2023-05-19 DIAGNOSIS — M1612 Unilateral primary osteoarthritis, left hip: Secondary | ICD-10-CM | POA: Diagnosis not present

## 2023-05-22 ENCOUNTER — Other Ambulatory Visit: Payer: Self-pay | Admitting: Orthopedic Surgery

## 2023-05-22 DIAGNOSIS — M1612 Unilateral primary osteoarthritis, left hip: Secondary | ICD-10-CM

## 2023-05-23 ENCOUNTER — Ambulatory Visit
Admission: RE | Admit: 2023-05-23 | Discharge: 2023-05-23 | Disposition: A | Discharge status: Home / Self Care | Source: Ambulatory Visit | Attending: Orthopedic Surgery | Admitting: Orthopedic Surgery

## 2023-05-23 DIAGNOSIS — M25552 Pain in left hip: Secondary | ICD-10-CM | POA: Diagnosis not present

## 2023-05-23 DIAGNOSIS — M1612 Unilateral primary osteoarthritis, left hip: Secondary | ICD-10-CM | POA: Diagnosis not present

## 2023-05-24 DIAGNOSIS — M7062 Trochanteric bursitis, left hip: Secondary | ICD-10-CM | POA: Diagnosis not present

## 2023-05-24 DIAGNOSIS — M1612 Unilateral primary osteoarthritis, left hip: Secondary | ICD-10-CM | POA: Diagnosis not present

## 2023-06-21 DIAGNOSIS — H353124 Nonexudative age-related macular degeneration, left eye, advanced atrophic with subfoveal involvement: Secondary | ICD-10-CM | POA: Diagnosis not present

## 2023-06-21 DIAGNOSIS — H353212 Exudative age-related macular degeneration, right eye, with inactive choroidal neovascularization: Secondary | ICD-10-CM | POA: Diagnosis not present

## 2023-06-21 DIAGNOSIS — Z961 Presence of intraocular lens: Secondary | ICD-10-CM | POA: Diagnosis not present

## 2023-06-21 DIAGNOSIS — H04123 Dry eye syndrome of bilateral lacrimal glands: Secondary | ICD-10-CM | POA: Diagnosis not present

## 2023-07-28 DIAGNOSIS — M1612 Unilateral primary osteoarthritis, left hip: Secondary | ICD-10-CM | POA: Diagnosis not present

## 2023-07-28 DIAGNOSIS — K5790 Diverticulosis of intestine, part unspecified, without perforation or abscess without bleeding: Secondary | ICD-10-CM | POA: Diagnosis not present

## 2023-07-28 DIAGNOSIS — R262 Difficulty in walking, not elsewhere classified: Secondary | ICD-10-CM | POA: Diagnosis not present

## 2023-07-28 DIAGNOSIS — R42 Dizziness and giddiness: Secondary | ICD-10-CM | POA: Diagnosis not present

## 2023-07-28 DIAGNOSIS — I700 Atherosclerosis of aorta: Secondary | ICD-10-CM | POA: Diagnosis not present

## 2023-07-28 DIAGNOSIS — H9193 Unspecified hearing loss, bilateral: Secondary | ICD-10-CM | POA: Diagnosis not present

## 2023-07-28 DIAGNOSIS — N390 Urinary tract infection, site not specified: Secondary | ICD-10-CM | POA: Diagnosis not present

## 2023-07-28 DIAGNOSIS — K8020 Calculus of gallbladder without cholecystitis without obstruction: Secondary | ICD-10-CM | POA: Diagnosis not present

## 2023-07-28 DIAGNOSIS — I10 Essential (primary) hypertension: Secondary | ICD-10-CM | POA: Diagnosis not present

## 2023-07-28 DIAGNOSIS — R790 Abnormal level of blood mineral: Secondary | ICD-10-CM | POA: Diagnosis not present

## 2023-07-28 DIAGNOSIS — K449 Diaphragmatic hernia without obstruction or gangrene: Secondary | ICD-10-CM | POA: Diagnosis not present

## 2023-07-28 DIAGNOSIS — M25552 Pain in left hip: Secondary | ICD-10-CM | POA: Diagnosis not present

## 2023-08-26 ENCOUNTER — Emergency Department

## 2023-08-26 ENCOUNTER — Other Ambulatory Visit: Payer: Self-pay

## 2023-08-26 ENCOUNTER — Emergency Department
Admission: EM | Admit: 2023-08-26 | Discharge: 2023-08-26 | Disposition: A | Discharge status: Home / Self Care | Attending: Emergency Medicine | Admitting: Emergency Medicine

## 2023-08-26 DIAGNOSIS — R079 Chest pain, unspecified: Secondary | ICD-10-CM | POA: Diagnosis not present

## 2023-08-26 DIAGNOSIS — I10 Essential (primary) hypertension: Secondary | ICD-10-CM | POA: Diagnosis not present

## 2023-08-26 DIAGNOSIS — I2510 Atherosclerotic heart disease of native coronary artery without angina pectoris: Secondary | ICD-10-CM | POA: Diagnosis not present

## 2023-08-26 DIAGNOSIS — K449 Diaphragmatic hernia without obstruction or gangrene: Secondary | ICD-10-CM | POA: Diagnosis not present

## 2023-08-26 DIAGNOSIS — I700 Atherosclerosis of aorta: Secondary | ICD-10-CM | POA: Diagnosis not present

## 2023-08-26 DIAGNOSIS — R0902 Hypoxemia: Secondary | ICD-10-CM | POA: Diagnosis not present

## 2023-08-26 DIAGNOSIS — I771 Stricture of artery: Secondary | ICD-10-CM | POA: Diagnosis not present

## 2023-08-26 DIAGNOSIS — R0789 Other chest pain: Secondary | ICD-10-CM | POA: Diagnosis not present

## 2023-08-26 LAB — CBC
HCT: 39.6 % (ref 36.0–46.0)
Hemoglobin: 13.2 g/dL (ref 12.0–15.0)
MCH: 32.3 pg (ref 26.0–34.0)
MCHC: 33.3 g/dL (ref 30.0–36.0)
MCV: 96.8 fL (ref 80.0–100.0)
Platelets: 203 10*3/uL (ref 150–400)
RBC: 4.09 MIL/uL (ref 3.87–5.11)
RDW: 13.8 % (ref 11.5–15.5)
WBC: 3.5 10*3/uL — ABNORMAL LOW (ref 4.0–10.5)
nRBC: 0 % (ref 0.0–0.2)

## 2023-08-26 LAB — BASIC METABOLIC PANEL WITH GFR
Anion gap: 9 (ref 5–15)
BUN: 13 mg/dL (ref 8–23)
CO2: 22 mmol/L (ref 22–32)
Calcium: 8.7 mg/dL — ABNORMAL LOW (ref 8.9–10.3)
Chloride: 105 mmol/L (ref 98–111)
Creatinine, Ser: 0.49 mg/dL (ref 0.44–1.00)
GFR, Estimated: >60 mL/min (ref >60)
Glucose, Bld: 96 mg/dL (ref 70–99)
Potassium: 4.1 mmol/L (ref 3.5–5.1)
Sodium: 136 mmol/L (ref 135–145)

## 2023-08-26 LAB — TROPONIN I (HIGH SENSITIVITY)
Troponin I (High Sensitivity): 9 ng/L (ref <18)
Troponin I (High Sensitivity): 9 ng/L (ref <18)

## 2023-08-26 MED ORDER — ALUM & MAG HYDROXIDE-SIMETH 200-200-20 MG/5ML PO SUSP
30.0000 mL | Freq: Once | ORAL | Status: AC
  Administered 2023-08-26: 30 mL via ORAL
  Filled 2023-08-26: qty 30

## 2023-08-26 MED ORDER — LIDOCAINE VISCOUS HCL 2 % MT SOLN
15.0000 mL | Freq: Once | OROMUCOSAL | Status: AC
  Administered 2023-08-26: 15 mL via ORAL
  Filled 2023-08-26: qty 15

## 2023-08-26 MED ORDER — PANTOPRAZOLE SODIUM 40 MG PO TBEC: 40.0000 mg | DELAYED_RELEASE_TABLET | Freq: Every day | ORAL | 0 refills | Status: AC

## 2023-08-26 NOTE — ED Triage Notes (Signed)
 Pt to ED via [facility] from home. Pt reports right sided CP that started this morning and feeling like she was having reflux. EMS gave 324mg  ASA. Pt reports hx of MI. No blood thinners.

## 2023-08-26 NOTE — ED Notes (Signed)
 Pt denies Maalox and lidocaine  relieving her chest pressure.

## 2023-08-26 NOTE — ED Triage Notes (Signed)
 First Nurse Note;  Pt via [facility] from home. Pt c/o R sided chest pain and radiates to the L side. EMS gave 324 ASA, pt has a hx of MI in the past. Denies blood thinner. Pt is A&Ox4 and NAD.  VSS per EMS

## 2023-08-26 NOTE — ED Provider Notes (Signed)
 [facility] Provider Note    Event Date/Time   First MD Initiated Contact with Patient 08/26/23 1202     (approximate)   History   Chief Complaint Chest Pain   HPI  Amber Sanford is a 88 y.o. female with past medical history of hyperlipidemia, CAD, and GERD who presents to the ED complaining of chest pain.  Patient reports that she woke up this morning with severe "heartburn," but took some medicine and symptoms began to gradually improve.  She was able to go back to sleep, but when she woke up the second time continue to notice pressure in her chest.  She states that the pressure started in the center of her chest, has since moved to the right side and then the left side before returning to the right side.  She denies any associated difficulty breathing, does report feeling tight in her chest.  She has not had any fevers, cough, pain or swelling in her legs.     Physical Exam   Triage Vital Signs: ED Triage Vitals [08/26/23 1200]  Encounter Vitals Group     BP (!) 162/75     Systolic BP Percentile      Diastolic BP Percentile      Pulse Rate 66     Resp 20     Temp 98.4 F (36.9 C)     Temp Source Oral     SpO2 100 %     Weight      Height      Head Circumference      Peak Flow      Pain Score 5     Pain Loc      Pain Education      Exclude from Growth Chart     Most recent vital signs: Vitals:   08/26/23 1330 08/26/23 1345  BP:    Pulse:  82  Resp: 15   Temp:    SpO2: 93% 98%    Constitutional: Alert and oriented. Eyes: Conjunctivae are normal. Head: Atraumatic. Nose: No congestion/rhinnorhea. Mouth/Throat: Mucous membranes are moist.  Cardiovascular: Normal rate, regular rhythm. Grossly normal heart sounds.  2+ radial pulses bilaterally. Respiratory: Normal respiratory effort.  No retractions. Lungs CTAB. Gastrointestinal: Soft and nontender. No distention. Musculoskeletal: No lower extremity tenderness nor edema.   Neurologic:  Normal speech and language. No gross focal neurologic deficits are appreciated.    ED Results / Procedures / Treatments   Labs (all labs ordered are listed, but only abnormal results are displayed) Labs Reviewed  BASIC METABOLIC PANEL WITH GFR - Abnormal; Notable for the following components:      Result Value   Calcium 8.7 (*)    All other components within normal limits  CBC - Abnormal; Notable for the following components:   WBC 3.5 (*)    All other components within normal limits  TROPONIN I (HIGH SENSITIVITY)  TROPONIN I (HIGH SENSITIVITY)     EKG  ED ECG REPORT I, Twilla Galea, the attending physician, personally viewed and interpreted this ECG.   Date: 08/26/2023  EKG Time: 12:01  Rate: 68  Rhythm: normal sinus rhythm  Axis: Normal  Intervals:none  ST&T Change: None  RADIOLOGY Chest x-ray reviewed and interpreted by me with no infiltrate, edema, or effusion.  PROCEDURES:  Critical Care performed: No  Procedures   MEDICATIONS ORDERED IN ED: Medications  alum & mag hydroxide-simeth (MAALOX/MYLANTA) 200-200-20 MG/5ML suspension 30 mL (30 mLs Oral Given 08/26/23 1232)  And  lidocaine  (XYLOCAINE ) 2 % viscous mouth solution 15 mL (15 mLs Oral Given 08/26/23 1232)     IMPRESSION / MDM / ASSESSMENT AND PLAN / ED COURSE  I reviewed the triage vital signs and the nursing notes.                              88 y.o. female with past medical history of hyperlipidemia, CAD, and GERD who presents to the ED complaining of pressure and tightness in her chest that has moved from the center to either side since waking up this morning.  Patient's presentation is most consistent with acute presentation with potential threat to life or bodily function.  Differential diagnosis includes, but is not limited to, ACS, PE, pneumonia, pneumothorax, musculoskeletal pain, GERD, anxiety.  Patient nontoxic-appearing and in no acute distress, vital signs are  unremarkable.  EKG shows no evidence of arrhythmia or ischemia, symptoms seem atypical for ACS.  Chest x-ray negative for acute process but does note large hiatal hernia.  We will treat with GI cocktail, lab results pending at this time.  Labs without significant anemia, leukocytosis, tract abnormality, or AKI.  2 sets of troponin are within normal limits and pain improved following GI cocktail.  Suspect symptoms related to GERD given her large hiatal hernia and otherwise reassuring workup.  She is appropriate for discharge home with outpatient follow-up, we will start her on a PPI.  She was counseled to return to the ED for new or worsening symptoms, patient agrees with plan.      FINAL CLINICAL IMPRESSION(S) / ED DIAGNOSES   Final diagnoses:  Nonspecific chest pain  Hiatal hernia     Rx / DC Orders   ED Discharge Orders          Ordered    pantoprazole (PROTONIX) 40 MG tablet  Daily        08/26/23 1503             Note:  This document was prepared using Dragon voice recognition software and may include unintentional dictation errors.   Twilla Galea, MD 08/26/23 1504

## 2023-09-21 DIAGNOSIS — H04123 Dry eye syndrome of bilateral lacrimal glands: Secondary | ICD-10-CM | POA: Diagnosis not present

## 2023-09-21 DIAGNOSIS — Z961 Presence of intraocular lens: Secondary | ICD-10-CM | POA: Diagnosis not present
